# Patient Record
Sex: Female | Born: 1950 | Race: White | Hispanic: No | Marital: Married | Smoking: Former smoker
Health system: Southern US, Community
[De-identification: ages and names within clinical notes are randomized; demographics above are authoritative.]

## PROBLEM LIST (undated history)

## (undated) DIAGNOSIS — J45909 Unspecified asthma, uncomplicated: Secondary | ICD-10-CM

## (undated) DIAGNOSIS — E611 Iron deficiency: Secondary | ICD-10-CM

## (undated) HISTORY — PX: TONSILLECTOMY: SUR1361

## (undated) HISTORY — PX: ABDOMINAL HYSTERECTOMY: SHX81

---

## 2006-10-04 ENCOUNTER — Ambulatory Visit: Payer: Self-pay | Admitting: Internal Medicine

## 2006-10-07 ENCOUNTER — Ambulatory Visit: Payer: Self-pay | Admitting: Internal Medicine

## 2006-10-24 ENCOUNTER — Ambulatory Visit: Payer: Self-pay | Admitting: Internal Medicine

## 2007-08-18 ENCOUNTER — Ambulatory Visit: Payer: Self-pay | Admitting: Family Medicine

## 2008-06-17 ENCOUNTER — Ambulatory Visit: Payer: Self-pay | Admitting: Family Medicine

## 2009-07-22 ENCOUNTER — Ambulatory Visit: Payer: Self-pay | Admitting: Gastroenterology

## 2009-07-25 ENCOUNTER — Ambulatory Visit: Payer: Self-pay | Admitting: Gastroenterology

## 2010-03-09 ENCOUNTER — Ambulatory Visit: Payer: Self-pay | Admitting: Nurse Practitioner

## 2010-03-14 ENCOUNTER — Ambulatory Visit: Payer: Self-pay | Admitting: Family Medicine

## 2011-01-15 ENCOUNTER — Ambulatory Visit: Payer: Self-pay | Admitting: Internal Medicine

## 2014-09-23 ENCOUNTER — Ambulatory Visit
Admit: 2014-09-23 | Disposition: A | Discharge status: Home / Self Care | Payer: Self-pay | Attending: Family Medicine | Admitting: Family Medicine

## 2015-07-04 ENCOUNTER — Other Ambulatory Visit: Payer: Self-pay | Admitting: Orthopedic Surgery

## 2015-07-04 DIAGNOSIS — M25511 Pain in right shoulder: Secondary | ICD-10-CM

## 2015-07-22 ENCOUNTER — Ambulatory Visit
Admission: RE | Admit: 2015-07-22 | Discharge: 2015-07-22 | Disposition: A | Discharge status: Home / Self Care | Payer: 59 | Source: Ambulatory Visit | Attending: Orthopedic Surgery | Admitting: Orthopedic Surgery

## 2015-07-22 DIAGNOSIS — M25511 Pain in right shoulder: Secondary | ICD-10-CM | POA: Diagnosis not present

## 2015-07-22 MED ORDER — IOHEXOL 300 MG/ML  SOLN
10.0000 mL | Freq: Once | INTRAMUSCULAR | Status: AC | PRN
  Administered 2015-07-22: 10 mL via INTRA_ARTICULAR

## 2015-07-22 MED ORDER — GADOBENATE DIMEGLUMINE 529 MG/ML IV SOLN
0.1000 mL | Freq: Once | INTRAVENOUS | Status: AC | PRN
Start: 2015-07-22 — End: 2015-07-22
  Administered 2015-07-22: 0.1 mL via INTRA_ARTICULAR

## 2015-08-04 ENCOUNTER — Other Ambulatory Visit: Payer: Self-pay | Admitting: Orthopedic Surgery

## 2015-08-05 ENCOUNTER — Other Ambulatory Visit: Payer: Self-pay

## 2015-08-05 ENCOUNTER — Encounter
Admission: RE | Admit: 2015-08-05 | Discharge: 2015-08-05 | Disposition: A | Discharge status: Home / Self Care | Payer: 59 | Source: Ambulatory Visit | Attending: Orthopedic Surgery | Admitting: Orthopedic Surgery

## 2015-08-05 DIAGNOSIS — Z01812 Encounter for preprocedural laboratory examination: Secondary | ICD-10-CM | POA: Diagnosis present

## 2015-08-05 DIAGNOSIS — Z01810 Encounter for preprocedural cardiovascular examination: Secondary | ICD-10-CM | POA: Diagnosis present

## 2015-08-05 HISTORY — DX: Iron deficiency: E61.1

## 2015-08-05 HISTORY — DX: Unspecified asthma, uncomplicated: J45.909

## 2015-08-05 LAB — CBC WITH DIFFERENTIAL/PLATELET
Basophils Absolute: 0 10*3/uL (ref 0–0.1)
Basophils Relative: 0 %
EOS ABS: 0.1 10*3/uL (ref 0–0.7)
Eosinophils Relative: 1 %
HEMATOCRIT: 42 % (ref 35.0–47.0)
HEMOGLOBIN: 14.1 g/dL (ref 12.0–16.0)
LYMPHS ABS: 0.4 10*3/uL — AB (ref 1.0–3.6)
LYMPHS PCT: 4 %
MCH: 30 pg (ref 26.0–34.0)
MCHC: 33.6 g/dL (ref 32.0–36.0)
MCV: 89.4 fL (ref 80.0–100.0)
MONOS PCT: 6 %
Monocytes Absolute: 0.6 10*3/uL (ref 0.2–0.9)
NEUTROS PCT: 89 %
Neutro Abs: 10.2 10*3/uL — ABNORMAL HIGH (ref 1.4–6.5)
Platelets: 268 10*3/uL (ref 150–440)
RBC: 4.69 MIL/uL (ref 3.80–5.20)
RDW: 13.9 % (ref 11.5–14.5)
WBC: 11.3 10*3/uL — AB (ref 3.6–11.0)

## 2015-08-05 LAB — BASIC METABOLIC PANEL
Anion gap: 8 (ref 5–15)
BUN: 25 mg/dL — ABNORMAL HIGH (ref 6–20)
CHLORIDE: 105 mmol/L (ref 101–111)
CO2: 27 mmol/L (ref 22–32)
CREATININE: 0.87 mg/dL (ref 0.44–1.00)
Calcium: 9 mg/dL (ref 8.9–10.3)
GFR calc non Af Amer: >60 mL/min (ref >60)
Glucose, Bld: 123 mg/dL — ABNORMAL HIGH (ref 65–99)
POTASSIUM: 4.1 mmol/L (ref 3.5–5.1)
SODIUM: 140 mmol/L (ref 135–145)

## 2015-08-05 LAB — PROTIME-INR
INR: 1.23
Prothrombin Time: 15.7 seconds — ABNORMAL HIGH (ref 11.4–15.0)

## 2015-08-05 LAB — APTT: aPTT: 31 seconds (ref 24–36)

## 2015-08-05 NOTE — Patient Instructions (Signed)
  Your procedure is scheduled on: Tuesday Aug 09, 2015. Report to Same Day Surgery. To find out your arrival time please call [phone] between 1PM - 3PM on Monday Feb. 13, 2017.  Remember: Instructions that are not followed completely may result in serious medical risk, up to and including death, or upon the discretion of your surgeon and anesthesiologist your surgery may need to be rescheduled.    _x___ 1. Do not eat food or drink liquids after midnight. No gum chewing or hard candies.     ____ 2. No Alcohol for 24 hours before or after surgery.   ____ 3. Bring all medications with you on the day of surgery if instructed.    __x__ 4. Notify your doctor if there is any change in your medical condition     (cold, fever, infections).     Do not wear jewelry, make-up, hairpins, clips or nail polish.  Do not wear lotions, powders, or perfumes. You may wear deodorant.  Do not shave 48 hours prior to surgery. Men may shave face and neck.  Do not bring valuables to the hospital.    [facility] is not responsible for any belongings or valuables.               Contacts, dentures or bridgework may not be worn into surgery.  Leave your suitcase in the car. After surgery it may be brought to your room.  For patients admitted to the hospital, discharge time is determined by your treatment team.   Patients discharged the day of surgery will not be allowed to drive home.    Please read over the following fact sheets that you were given:   [facility] Preparing for Surgery  ____ Take these medicines the morning of surgery with A SIP OF WATER: None     ____ Fleet Enema (as directed)   _x___ Use CHG Soap as directed  _x___ Use inhalers on the day of surgery and bring to the hospital.  ____ Stop metformin 2 days prior to surgery    ____ Take 1/2 of usual insulin dose the night before surgery and none on the morning of surgery.   ____ Stop Coumadin/Plavix/aspirin on does not  apply.  ____ Stop Anti-inflammatories on does not apply.  Tylenol OK to use for pain.   ____ Stop supplements until after surgery.    ____ Bring C-Pap to the hospital.

## 2015-08-05 NOTE — Pre-Procedure Instructions (Signed)
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  Spoke with Dr. Rosey Bath, ok to proceed.

## 2015-08-10 ENCOUNTER — Encounter: Admission: RE | Payer: Self-pay | Source: Ambulatory Visit

## 2015-08-10 ENCOUNTER — Ambulatory Visit: Admission: RE | Admit: 2015-08-10 | Payer: 59 | Source: Ambulatory Visit | Admitting: Orthopedic Surgery

## 2015-08-10 SURGERY — ARTHROSCOPY, SHOULDER WITH REPAIR, ROTATOR CUFF, OPEN
Anesthesia: General | Laterality: Right

## 2015-08-24 ENCOUNTER — Ambulatory Visit: Payer: 59 | Admitting: Anesthesiology

## 2015-08-24 ENCOUNTER — Encounter: Payer: Self-pay | Admitting: Anesthesiology

## 2015-08-24 ENCOUNTER — Ambulatory Visit
Admission: RE | Admit: 2015-08-24 | Discharge: 2015-08-24 | Disposition: A | Discharge status: Home / Self Care | Payer: 59 | Source: Ambulatory Visit | Attending: Orthopedic Surgery | Admitting: Orthopedic Surgery

## 2015-08-24 ENCOUNTER — Encounter
Admission: RE | Disposition: A | Discharge status: Home / Self Care | Payer: Self-pay | Source: Ambulatory Visit | Attending: Orthopedic Surgery

## 2015-08-24 DIAGNOSIS — M25511 Pain in right shoulder: Secondary | ICD-10-CM | POA: Diagnosis present

## 2015-08-24 DIAGNOSIS — Z885 Allergy status to narcotic agent status: Secondary | ICD-10-CM | POA: Insufficient documentation

## 2015-08-24 DIAGNOSIS — Z90710 Acquired absence of both cervix and uterus: Secondary | ICD-10-CM | POA: Insufficient documentation

## 2015-08-24 DIAGNOSIS — D509 Iron deficiency anemia, unspecified: Secondary | ICD-10-CM | POA: Diagnosis not present

## 2015-08-24 DIAGNOSIS — J45909 Unspecified asthma, uncomplicated: Secondary | ICD-10-CM | POA: Diagnosis not present

## 2015-08-24 DIAGNOSIS — Z888 Allergy status to other drugs, medicaments and biological substances status: Secondary | ICD-10-CM | POA: Diagnosis not present

## 2015-08-24 DIAGNOSIS — Z7951 Long term (current) use of inhaled steroids: Secondary | ICD-10-CM | POA: Diagnosis not present

## 2015-08-24 DIAGNOSIS — Z79899 Other long term (current) drug therapy: Secondary | ICD-10-CM | POA: Diagnosis not present

## 2015-08-24 DIAGNOSIS — Z87891 Personal history of nicotine dependence: Secondary | ICD-10-CM | POA: Insufficient documentation

## 2015-08-24 DIAGNOSIS — M75121 Complete rotator cuff tear or rupture of right shoulder, not specified as traumatic: Secondary | ICD-10-CM | POA: Diagnosis not present

## 2015-08-24 HISTORY — PX: SHOULDER ARTHROSCOPY WITH OPEN ROTATOR CUFF REPAIR: SHX6092

## 2015-08-24 SURGERY — ARTHROSCOPY, SHOULDER WITH REPAIR, ROTATOR CUFF, OPEN
Anesthesia: General | Laterality: Right

## 2015-08-24 MED ORDER — LIDOCAINE HCL (PF) 1 % IJ SOLN
INTRAMUSCULAR | Status: AC
  Filled 2015-08-24: qty 30

## 2015-08-24 MED ORDER — OXYCODONE HCL 5 MG PO TABS: 5.0000 mg | ORAL_TABLET | ORAL | Status: DC | PRN

## 2015-08-24 MED ORDER — FENTANYL CITRATE (PF) 100 MCG/2ML IJ SOLN
INTRAMUSCULAR | Status: DC
Start: 2015-08-24 — End: 2015-08-24
  Filled 2015-08-24: qty 2

## 2015-08-24 MED ORDER — MIDAZOLAM HCL 2 MG/2ML IJ SOLN
INTRAMUSCULAR | Status: DC | PRN
  Administered 2015-08-24: 2 mg via INTRAVENOUS

## 2015-08-24 MED ORDER — LACTATED RINGERS IV SOLN
INTRAVENOUS | Status: DC
  Administered 2015-08-24 (×2): via INTRAVENOUS

## 2015-08-24 MED ORDER — ONDANSETRON HCL 4 MG/2ML IJ SOLN
INTRAMUSCULAR | Status: DC | PRN
  Administered 2015-08-24: 4 mg via INTRAVENOUS

## 2015-08-24 MED ORDER — LIDOCAINE HCL 1 % IJ SOLN
INTRAMUSCULAR | Status: DC | PRN
  Administered 2015-08-24: 15 mL

## 2015-08-24 MED ORDER — CEFAZOLIN SODIUM-DEXTROSE 2-3 GM-% IV SOLR
INTRAVENOUS | Status: AC
  Filled 2015-08-24: qty 50

## 2015-08-24 MED ORDER — FENTANYL CITRATE (PF) 100 MCG/2ML IJ SOLN
25.0000 ug | INTRAMUSCULAR | Status: DC | PRN
  Administered 2015-08-24 (×2): 25 ug via INTRAVENOUS

## 2015-08-24 MED ORDER — PROMETHAZINE HCL 25 MG/ML IJ SOLN
6.2500 mg | Freq: Four times a day (QID) | INTRAMUSCULAR | Status: DC | PRN
  Administered 2015-08-24: 6.25 mg via INTRAVENOUS

## 2015-08-24 MED ORDER — BUPIVACAINE HCL 0.25 % IJ SOLN
INTRAMUSCULAR | Status: DC | PRN
  Administered 2015-08-24: 30 mL

## 2015-08-24 MED ORDER — NEOMYCIN-POLYMYXIN B GU 40-200000 IR SOLN
Status: AC
  Filled 2015-08-24: qty 2

## 2015-08-24 MED ORDER — BUPIVACAINE-EPINEPHRINE (PF) 0.25% -1:200000 IJ SOLN
INTRAMUSCULAR | Status: AC
  Filled 2015-08-24: qty 30

## 2015-08-24 MED ORDER — ONDANSETRON HCL 4 MG PO TABS: 4.0000 mg | ORAL_TABLET | Freq: Three times a day (TID) | ORAL | Status: AC | PRN

## 2015-08-24 MED ORDER — PHENYLEPHRINE HCL 10 MG/ML IJ SOLN
INTRAMUSCULAR | Status: DC | PRN
  Administered 2015-08-24 (×4): 100 ug via INTRAVENOUS

## 2015-08-24 MED ORDER — CEFAZOLIN SODIUM-DEXTROSE 2-3 GM-% IV SOLR
2.0000 g | INTRAVENOUS | Status: AC
  Administered 2015-08-24: 2 g via INTRAVENOUS

## 2015-08-24 MED ORDER — NEOSTIGMINE METHYLSULFATE 10 MG/10ML IV SOLN
INTRAVENOUS | Status: DC | PRN
  Administered 2015-08-24: 1 mg via INTRAVENOUS

## 2015-08-24 MED ORDER — ONDANSETRON HCL 4 MG/2ML IJ SOLN
INTRAMUSCULAR | Status: AC
  Filled 2015-08-24: qty 2

## 2015-08-24 MED ORDER — DEXAMETHASONE SODIUM PHOSPHATE 10 MG/ML IJ SOLN
INTRAMUSCULAR | Status: DC | PRN
  Administered 2015-08-24: 10 mg via INTRAVENOUS

## 2015-08-24 MED ORDER — SUCCINYLCHOLINE CHLORIDE 20 MG/ML IJ SOLN
INTRAMUSCULAR | Status: DC | PRN
  Administered 2015-08-24: 100 mg via INTRAVENOUS

## 2015-08-24 MED ORDER — FAMOTIDINE 20 MG PO TABS
20.0000 mg | ORAL_TABLET | Freq: Once | ORAL | Status: AC
  Administered 2015-08-24: 20 mg via ORAL

## 2015-08-24 MED ORDER — FENTANYL CITRATE (PF) 100 MCG/2ML IJ SOLN
INTRAMUSCULAR | Status: DC | PRN
  Administered 2015-08-24: 100 ug via INTRAVENOUS
  Administered 2015-08-24 (×3): 50 ug via INTRAVENOUS

## 2015-08-24 MED ORDER — ONDANSETRON HCL 4 MG/2ML IJ SOLN
4.0000 mg | Freq: Once | INTRAMUSCULAR | Status: AC | PRN
Start: 2015-08-24 — End: 2015-08-24
  Administered 2015-08-24: 4 mg via INTRAVENOUS

## 2015-08-24 MED ORDER — EPINEPHRINE HCL 1 MG/ML IJ SOLN
INTRAMUSCULAR | Status: AC
  Filled 2015-08-24: qty 1

## 2015-08-24 MED ORDER — ROCURONIUM BROMIDE 100 MG/10ML IV SOLN
INTRAVENOUS | Status: DC | PRN
  Administered 2015-08-24: 20 mg via INTRAVENOUS

## 2015-08-24 MED ORDER — PROMETHAZINE HCL 25 MG/ML IJ SOLN
INTRAMUSCULAR | Status: AC
  Filled 2015-08-24: qty 1

## 2015-08-24 MED ORDER — SODIUM CHLORIDE 0.9 % IJ SOLN
INTRAMUSCULAR | Status: AC
  Filled 2015-08-24: qty 10

## 2015-08-24 MED ORDER — PROPOFOL 10 MG/ML IV BOLUS
INTRAVENOUS | Status: DC | PRN
  Administered 2015-08-24: 150 mg via INTRAVENOUS

## 2015-08-24 MED ORDER — FAMOTIDINE 20 MG PO TABS
ORAL_TABLET | ORAL | Status: AC
  Administered 2015-08-24: 20 mg via ORAL
  Filled 2015-08-24: qty 1

## 2015-08-24 MED ORDER — GLYCOPYRROLATE 0.2 MG/ML IJ SOLN
INTRAMUSCULAR | Status: DC | PRN
  Administered 2015-08-24: 0.4 mg via INTRAVENOUS

## 2015-08-24 MED ORDER — EPHEDRINE SULFATE 50 MG/ML IJ SOLN
INTRAMUSCULAR | Status: DC | PRN
  Administered 2015-08-24: 10 mg via INTRAVENOUS

## 2015-08-24 SURGICAL SUPPLY — 68 items
ADAPTER IRRIG TUBE 2 SPIKE SOL (ADAPTER) ×6 IMPLANT
ANCHOR ALL-SUT Q-FIX 2.8 (Anchor) ×3 IMPLANT
BUR RADIUS 4.0X18.5 (BURR) ×3 IMPLANT
BUR RADIUS 5.5 (BURR) ×3 IMPLANT
CANNULA 5.75X7 CRYSTAL CLEAR (CANNULA) ×3 IMPLANT
CANNULA PARTIAL THREAD 2X7 (CANNULA) ×3 IMPLANT
CANNULA TWIST IN 8.25X9CM (CANNULA) ×6 IMPLANT
CLOSURE WOUND 1/2 X4 (GAUZE/BANDAGES/DRESSINGS) ×1
CONNECTOR M SMARTSTITCH (Connector) ×3 IMPLANT
CONNECTOR PERFECT PASSER (CONNECTOR) ×3 IMPLANT
COOLER POLAR GLACIER W/PUMP (MISCELLANEOUS) ×3 IMPLANT
CRADLE LAMINECT ARM (MISCELLANEOUS) ×3 IMPLANT
DRAPE IMP U-DRAPE 54X76 (DRAPES) ×6 IMPLANT
DRAPE INCISE IOBAN 66X45 STRL (DRAPES) ×3 IMPLANT
DRAPE U-SHAPE 47X51 STRL (DRAPES) ×3 IMPLANT
DURAPREP 26ML APPLICATOR (WOUND CARE) ×9 IMPLANT
ELECT REM PT RETURN 9FT ADLT (ELECTROSURGICAL) ×3
ELECTRODE REM PT RTRN 9FT ADLT (ELECTROSURGICAL) ×1 IMPLANT
GAUZE PETRO XEROFOAM 1X8 (MISCELLANEOUS) ×3 IMPLANT
GAUZE SPONGE 4X4 12PLY STRL (GAUZE/BANDAGES/DRESSINGS) ×6 IMPLANT
GLOVE BIOGEL PI IND STRL 9 (GLOVE) ×1 IMPLANT
GLOVE BIOGEL PI INDICATOR 9 (GLOVE) ×2
GLOVE SURG 9.0 ORTHO LTXF (GLOVE) ×6 IMPLANT
GOWN STRL REUS TWL 2XL XL LVL4 (GOWN DISPOSABLE) ×3 IMPLANT
GOWN STRL REUS W/ TWL LRG LVL3 (GOWN DISPOSABLE) ×1 IMPLANT
GOWN STRL REUS W/ TWL LRG LVL4 (GOWN DISPOSABLE) ×1 IMPLANT
GOWN STRL REUS W/TWL LRG LVL3 (GOWN DISPOSABLE) ×2
GOWN STRL REUS W/TWL LRG LVL4 (GOWN DISPOSABLE) ×2
IV LACTATED RINGER IRRG 3000ML (IV SOLUTION) ×20
IV LR IRRIG 3000ML ARTHROMATIC (IV SOLUTION) ×10 IMPLANT
KIT RM TURNOVER STRD PROC AR (KITS) ×3 IMPLANT
KIT STABILIZATION SHOULDER (MISCELLANEOUS) ×3 IMPLANT
KIT SUTURE 2.8 Q-FIX DISP (MISCELLANEOUS) ×6 IMPLANT
MANIFOLD NEPTUNE II (INSTRUMENTS) ×3 IMPLANT
MASK FACE SPIDER DISP (MASK) ×3 IMPLANT
MAT BLUE FLOOR 46X72 FLO (MISCELLANEOUS) ×6 IMPLANT
NDL SAFETY 18GX1.5 (NEEDLE) ×3 IMPLANT
NDL SAFETY 22GX1.5 (NEEDLE) ×3 IMPLANT
NS IRRIG 500ML POUR BTL (IV SOLUTION) ×3 IMPLANT
PACK ARTHROSCOPY SHOULDER (MISCELLANEOUS) ×3 IMPLANT
PAD WRAPON POLAR SHDR UNIV (MISCELLANEOUS) ×1 IMPLANT
PASSER SUT CAPTURE FIRST (SUTURE) ×3 IMPLANT
SET TUBE SUCT SHAVER OUTFL 24K (TUBING) ×3 IMPLANT
SET TUBE TIP INTRA-ARTICULAR (MISCELLANEOUS) ×3 IMPLANT
SMARTSTITCH M-CONNECTOR ×3 IMPLANT
STRAP SAFETY BODY (MISCELLANEOUS) ×3 IMPLANT
STRIP CLOSURE SKIN 1/2X4 (GAUZE/BANDAGES/DRESSINGS) ×2 IMPLANT
SUT ETHILON 4-0 (SUTURE) ×2
SUT ETHILON 4-0 FS2 18XMFL BLK (SUTURE) ×1
SUT KNTLS 2.8 MAGNUM (Anchor) ×9 IMPLANT
SUT MNCRL 4-0 (SUTURE) ×2
SUT MNCRL 4-0 27XMFL (SUTURE) ×1
SUT PDS AB 0 CT1 27 (SUTURE) ×6 IMPLANT
SUT PERFECTPASSER WHITE CART (SUTURE) ×6 IMPLANT
SUT SMART STITCH CARTRIDGE (SUTURE) ×3 IMPLANT
SUT VIC AB 0 CT1 36 (SUTURE) ×6 IMPLANT
SUT VIC AB 2-0 CT2 27 (SUTURE) ×3 IMPLANT
SUTURE ETHLN 4-0 FS2 18XMF BLK (SUTURE) ×1 IMPLANT
SUTURE MAGNUM WIRE 2X48 BLK (SUTURE) ×6 IMPLANT
SUTURE MNCRL 4-0 27XMF (SUTURE) ×1 IMPLANT
SUTURE OPUS MAGNUM SZ 2 WHT (SUTURE) ×9 IMPLANT
SYRINGE 10CC LL (SYRINGE) ×3 IMPLANT
TAPE MICROFOAM 4IN (TAPE) ×3 IMPLANT
TUBING ARTHRO INFLOW-ONLY STRL (TUBING) ×3 IMPLANT
TUBING CONNECTING 10 (TUBING) ×2 IMPLANT
TUBING CONNECTING 10' (TUBING) ×1
WAND HAND CNTRL MULTIVAC 90 (MISCELLANEOUS) ×3 IMPLANT
WRAPON POLAR PAD SHDR UNIV (MISCELLANEOUS) ×3

## 2015-08-24 NOTE — H&P (Signed)
The patient has been re-examined, and the chart reviewed, and there have been no interval changes to the documented history and physical.    The risks, benefits, and alternatives have been discussed at length, and the patient is willing to proceed.   

## 2015-08-24 NOTE — Anesthesia Postprocedure Evaluation (Signed)
Anesthesia Post Note  Patient: Maria Gardner  Procedure(s) Performed: Procedure(s) (LRB): SHOULDER ARTHROSCOPY WITH OPEN ROTATOR CUFF REPAIR (Right)  Patient location during evaluation: PACU Anesthesia Type: General Level of consciousness: awake and alert Pain management: pain level controlled Vital Signs Assessment: post-procedure vital signs reviewed and stable Respiratory status: spontaneous breathing, nonlabored ventilation, respiratory function stable and patient connected to nasal cannula oxygen Cardiovascular status: blood pressure returned to baseline and stable Postop Assessment: no signs of nausea or vomiting Anesthetic complications: no    Last Vitals:  Filed Vitals:   08/24/15 1647 08/24/15 1702  BP: 117/60 116/63  Pulse: 78 82  Temp:    Resp: 16 14    Last Pain:  Filed Vitals:   08/24/15 1716  PainSc: Asleep                 Precious Haws Piscitello

## 2015-08-24 NOTE — Transfer of Care (Signed)
Immediate Anesthesia Transfer of Care Note  Patient: Maria Gardner  Procedure(s) Performed: Procedure(s): SHOULDER ARTHROSCOPY WITH OPEN ROTATOR CUFF REPAIR (Right)  Patient Location: PACU  Anesthesia Type:General  Level of Consciousness: sedated and responds to stimulation  Airway & Oxygen Therapy: Patient Spontanous Breathing and Patient connected to face mask oxygen  Post-op Assessment: Report given to RN and Post -op Vital signs reviewed and stable  Post vital signs: Reviewed and stable  Last Vitals:  Filed Vitals:   08/24/15 1142 08/24/15 1617  BP: 139/77 124/63  Pulse: 69 81  Temp: 36.5 C 36.2 C  Resp: 18 17    Complications: No apparent anesthesia complications

## 2015-08-24 NOTE — Anesthesia Procedure Notes (Signed)
Procedure Name: Intubation Date/Time: 08/24/2015 1:21 PM Performed by: Jonna Clark Pre-anesthesia Checklist: Patient identified, Patient being monitored, Timeout performed, Emergency Drugs available and Suction available Patient Re-evaluated:Patient Re-evaluated prior to inductionOxygen Delivery Method: Circle system utilized Preoxygenation: Pre-oxygenation with 100% oxygen Intubation Type: IV induction Ventilation: Mask ventilation without difficulty Laryngoscope Size: Miller and 2 Grade View: Grade I Tube type: Oral Tube size: 7.0 mm Number of attempts: 1 Placement Confirmation: ETT inserted through vocal cords under direct vision,  positive ETCO2 and breath sounds checked- equal and bilateral Secured at: 21 cm Tube secured with: Tape Dental Injury: Teeth and Oropharynx as per pre-operative assessment

## 2015-08-24 NOTE — Op Note (Signed)
08/24/2015  4:26 PM  PATIENT:  Maria Gardner  65 y.o. 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    The risks benefits and alternatives were discussed with the patient preoperatively including but not limited to the risks of infection, bleeding, nerve injury, persistent pain or weakness, failure of the hardware, re-tear of the rotator cuff and the need for further surgery. Medical risks include DVT and pulmonary embolism, myocardial infarction, stroke, pneumonia, respiratory failure and death. Patient understood these risks and wished to proceed.  OPERATIVE IMPLANTS: ArthroCare Magnum 2 anchors x 3 & [employer] Q Fix anchors x 1  OPERATIVE FINDINGS: Full thickness tear of the supraspinatus  OPERATIVE PROCEDURE: The patient was met in the preoperative area. The operative shoulder was signed with the word yes and my initials according the hospital's correct site of surgery protocol. The patient was then brought to the operating room where she underwent general endotracheal intubation and then placed in a beachchair position.  A spider arm positioner was used for this case. Examination under anesthesia revealed no limitation of motion or instability with load shift testing. The patient had a negative sulcus sign.  Patient was prepped and draped in a sterile fashion. A  timeout was performed to verify the patient's name, date of birth, medical record number, correct site of surgery and correct procedure to be performed there was also used to verify the patient received antibiotics that all appropriate instruments, implants and radiographs studies were available in the room. Once all in attendance were in agreement case began.  Bony landmarks were drawn out with a surgical marker along with proposed arthroscopy incisions. These were pre-injected with 1% lidocaine plain. An 11 blade was used to establish a posterior portal through which the arthroscope was placed in the glenohumeral joint. A full diagnostic examination of the shoulder was performed.  Patient was found t0 have a full-thss tear involving the supraspinatus  There was fraying of the superior labrum without SLAP tear.  A portion of the patient's intra-articular biceps tendon was adherent to the rotator cuff.  These adhesions between the 2 tendons were divided using a arthroscopic elevator.  An 18-gauge spinal needle was used to place a 0 PDS suture through the rotator cuff tear so it could be identified on the bursal side.    The arthroscope was then placed in the subacromial space. A lateral port created using an 18G spinal needle for locatlization. Extensive bursitis was encountered and debrided using a 4-0 resector shaver blade and a 90 ArthroCare wand . A subacromial decompression was also performed through the lateral portal using a 5.5 mm resector shaver blade. The rotator cuff tear was easily identified and the 0 PDS suture was removed.  Two Arthrocare Smart stitches were placed in the lateral border of the rotator cuff tear.  A 5-72m resector shaver blade was used to debride the greater tuberosity of all torn fibers of the rotator cuff until  punctate bleeding was identified.  All arthroscopic instruments were then removed and the mini-open portion of the procedure began.   A saber-type incision was made  along the lateral border of the acromion. The deltoid muscle was identified and split in line with its fibers which allowed visualization of the rotator cuff. The Smart stitches previously placed in the lateral border of the rotator cuff were brought out through the deltoid split.   A third Smart stitch was placed in the rotator cuff tear. One Tamala Julian and [employer] anchor was placed at the articular margin of the humeral head with the greater tuberosity. The suture limbs of the Q Fix anchor were passed medially through the rotator cuff using a First Pass suture passer.  The Smart stitches in the lateral portion of the rotator cuff tear were then anchored to the lateral aspect of the greater tuberosity footprint using three Magnum 2 anchors. These anchors were tensioned to allow for anatomic reduction of the rotator cuff to the greater tuberosity. The medial row sutures were then tied down using an arthroscopic knot tying technique.  Arthroscopic images of the repair were taken with the arthroscope both externally and from inside the glenohumeral joint.  All incisions were copiously irrigated. The deltoid fascia was repaired using a 0 Vicryl suture.  The subcutaneous tissue of all incisions were closed with a 2-0 Vicryl. Skin closure for the arthroscopic incisions was performed with 4-0 nylon. The skin edges of the saber incision was approximated with a running 4-0 undyed Monocryl.  0.25% Marcaine plain was then injected into the subacromial space for postoperative pain control. A dry sterile dressing was applied.  The patient was placed in an abduction sling, with a Polar Caresleeve, a TENS unit.  All sharp and instrument counts were correct at the conclusion of the case. I was scrubbed and present for the entire case. I spoke with the patient's husband postoperatively to let them know the case had gone without complication and the patient was stable in recovery room.  I let her daughter know the surgery had  gone well via phone.

## 2015-08-24 NOTE — Discharge Instructions (Signed)

## 2015-08-24 NOTE — Anesthesia Preprocedure Evaluation (Addendum)
Anesthesia Evaluation  Patient identified by MRN, date of birth, ID band Patient awake    Reviewed: Allergy & Precautions, NPO status , Patient's Chart, lab work & pertinent test results, reviewed documented beta blocker date and time   Airway Mallampati: III  TM Distance: >3 FB     Dental  (+) Chipped   Pulmonary asthma , former smoker,           Cardiovascular      Neuro/Psych    GI/Hepatic   Endo/Other    Renal/GU      Musculoskeletal   Abdominal   Peds  Hematology   Anesthesia Other Findings She refuses ISNB. EKG reviewed and OK. No CXR needed. No pulmonary symptoms now.  Reproductive/Obstetrics                           Anesthesia Physical Anesthesia Plan  ASA: III  Anesthesia Plan: General   Post-op Pain Management:    Induction: Intravenous  Airway Management Planned: Oral ETT  Additional Equipment:   Intra-op Plan:   Post-operative Plan:   Informed Consent: I have reviewed the patients History and Physical, chart, labs and discussed the procedure including the risks, benefits and alternatives for the proposed anesthesia with the patient or authorized representative who has indicated his/her understanding and acceptance.     Plan Discussed with: CRNA  Anesthesia Plan Comments:         Anesthesia Quick Evaluation

## 2015-08-24 NOTE — H&P (Signed)
PREOPERATIVE H&P  Chief Complaint: M75.121 Complete rotatr-cuff tear/ruptr of r shoulder, not trauma  HPI: Maria Gardner is a 65 y.o. female who presents for preoperative history and physical with a diagnosis of M75.121 Complete rotatr-cuff tear/ruptr of r shoulder, not trauma. Symptoms are rated as moderate to severe, and have been worsening.  This is significantly impairing activities of daily living.  She has elected for surgical management.   Past Medical History  Diagnosis Date  . Asthma   . Iron deficiency    Past Surgical History  Procedure Laterality Date  . Tonsillectomy    . Abdominal hysterectomy     Social History   Social History  . Marital Status: Married    Spouse Name: N/A  . Number of Children: N/A  . Years of Education: N/A   Social History Main Topics  . Smoking status: Former Smoker    Quit date: 08/04/1973  . Smokeless tobacco: None  . Alcohol Use: Yes     Comment: 1 glass of wine 2 times a year  . Drug Use: No  . Sexual Activity: Not Asked   Other Topics Concern  . None   Social History Narrative   History reviewed. No pertinent family history. Allergies  Allergen Reactions  . Ammonia Other (See Comments)    sends pt into an asthma attack  . Compazine [Prochlorperazine Edisylate] Other (See Comments)    Muscular contractions on face and hands  . Morphine And Related Other (See Comments)    Doubled over in pain and passing out  . Terbinafine And Related Other (See Comments)    Stroke  Symptoms, loss of feeling in face and swelling, weakness in right side of the body.   Prior to Admission medications   Medication Sig Start Date End Date Taking? Authorizing Provider  acetaminophen (TYLENOL) 325 MG tablet Take 650 mg by mouth every 6 (six) hours as needed.   Yes Historical Provider, MD  albuterol (PROVENTIL HFA;VENTOLIN HFA) 108 (90 Base) MCG/ACT inhaler Inhale 1 puff into the lungs as needed for wheezing or shortness of breath.   Yes  Historical Provider, MD  ipratropium (ATROVENT HFA) 17 MCG/ACT inhaler Inhale 1 puff into the lungs as needed for wheezing.   Yes Historical Provider, MD  ALBUTEROL IN Inhale 1 puff into the lungs as needed (for shortness of breath).    Historical Provider, MD     Positive ROS: All other systems have been reviewed and were otherwise negative with the exception of those mentioned in the HPI and as above.  Physical Exam: General: Alert, no acute distress Cardiovascular: Regular rate and rhythm, no murmurs rubs or gallops.  No pedal edema Respiratory: Clear to auscultation bilaterally, no wheezes rales or rhonchi. No cyanosis, no use of accessory musculature GI: No organomegaly, abdomen is soft and non-tender nondistended with positive bowel sounds. Skin: Skin intact, no lesions within the operative field. Neurologic: Sensation intact distally Psychiatric: Patient is competent for consent with normal mood and affect Lymphatic: No cervical lymphadenopathy  MUSCULOSKELETAL: Right shoulder/upper extremity:  Skin is intact. There is no erythema or ecchymosis or swelling. Patient has pain with active abduction. She has pain with a downward directed force on her abducted shoulder. She has positive impingement signs but no apprehension or instability. Patient demonstrates significant weakness of her supraspinatus and to a lesser extend the external rotators on exam today. She has palpable radial pulse. She has intact sensation light touch. She has full digital wrist and elbow range of  motion.   Assessment: M75.121 Complete rotatr-cuff tear/ruptr of r shoulder, not trauma  Plan: Plan for Procedure(s): SHOULDER ARTHROSCOPY WITH OPEN ROTATOR CUFF REPAIR  Was then we still had long-standing right shoulder pain affecting her ability to perform activities overhead and with abduction. She has failed all nonoperative management including physical therapy. Patient has an MRI which is confirmed a  full-thickness and retracted tear involving the supra and likely infraspinatus tendons. Plan will be for right shoulder arthroscopy with mini open rotator cuff repair. I explained this procedure in detail to her as well as the postoperative recovery. Patient is in agreement with the plan for surgery.  I discussed the risks and benefits of surgery. The risks include but are not limited to infection, bleeding requiring blood transfusion, nerve or blood vessel injury, joint stiffness or loss of motion, persistent pain, weakness or instability and hardware failure and the need for further surgery. Medical risks include but are not limited to DVT and pulmonary embolism, myocardial infarction, stroke, pneumonia, respiratory failure and death. Patient understood these risks and wished to proceed.   Thornton Park, MD   08/24/2015 12:51 PM

## 2015-08-25 ENCOUNTER — Encounter: Payer: Self-pay | Admitting: Orthopedic Surgery

## 2016-04-16 ENCOUNTER — Other Ambulatory Visit: Payer: Self-pay | Admitting: Family Medicine

## 2016-04-16 DIAGNOSIS — Z1231 Encounter for screening mammogram for malignant neoplasm of breast: Secondary | ICD-10-CM

## 2016-04-20 ENCOUNTER — Other Ambulatory Visit: Payer: Self-pay | Admitting: Family Medicine

## 2016-04-20 ENCOUNTER — Ambulatory Visit
Admission: RE | Admit: 2016-04-20 | Discharge: 2016-04-20 | Disposition: A | Discharge status: Home / Self Care | Payer: 59 | Source: Ambulatory Visit | Attending: Family Medicine | Admitting: Family Medicine

## 2016-04-20 DIAGNOSIS — Z1231 Encounter for screening mammogram for malignant neoplasm of breast: Secondary | ICD-10-CM

## 2016-09-08 ENCOUNTER — Encounter: Payer: Self-pay | Admitting: Medical Oncology

## 2016-09-08 ENCOUNTER — Emergency Department: Payer: Medicare HMO

## 2016-09-08 ENCOUNTER — Emergency Department
Admission: EM | Admit: 2016-09-08 | Discharge: 2016-09-08 | Disposition: A | Discharge status: Home / Self Care | Payer: Medicare HMO | Attending: Emergency Medicine | Admitting: Emergency Medicine

## 2016-09-08 DIAGNOSIS — J45901 Unspecified asthma with (acute) exacerbation: Secondary | ICD-10-CM | POA: Diagnosis not present

## 2016-09-08 DIAGNOSIS — Z87891 Personal history of nicotine dependence: Secondary | ICD-10-CM | POA: Insufficient documentation

## 2016-09-08 DIAGNOSIS — R05 Cough: Secondary | ICD-10-CM | POA: Diagnosis present

## 2016-09-08 DIAGNOSIS — J101 Influenza due to other identified influenza virus with other respiratory manifestations: Secondary | ICD-10-CM | POA: Diagnosis not present

## 2016-09-08 LAB — CBC WITH DIFFERENTIAL/PLATELET
Basophils Absolute: 0 10*3/uL (ref 0–0.1)
Basophils Relative: 0 %
EOS ABS: 0 10*3/uL (ref 0–0.7)
EOS PCT: 1 %
HCT: 39.5 % (ref 35.0–47.0)
HEMOGLOBIN: 13.7 g/dL (ref 12.0–16.0)
LYMPHS ABS: 1.1 10*3/uL (ref 1.0–3.6)
Lymphocytes Relative: 32 %
MCH: 31.1 pg (ref 26.0–34.0)
MCHC: 34.6 g/dL (ref 32.0–36.0)
MCV: 89.7 fL (ref 80.0–100.0)
MONOS PCT: 11 %
Monocytes Absolute: 0.4 10*3/uL (ref 0.2–0.9)
NEUTROS PCT: 56 %
Neutro Abs: 1.9 10*3/uL (ref 1.4–6.5)
PLATELETS: 196 10*3/uL (ref 150–440)
RBC: 4.4 MIL/uL (ref 3.80–5.20)
RDW: 13.2 % (ref 11.5–14.5)
WBC: 3.4 10*3/uL — ABNORMAL LOW (ref 3.6–11.0)

## 2016-09-08 LAB — BASIC METABOLIC PANEL
Anion gap: 8 (ref 5–15)
BUN: 10 mg/dL (ref 6–20)
CALCIUM: 8.8 mg/dL — AB (ref 8.9–10.3)
CHLORIDE: 105 mmol/L (ref 101–111)
CO2: 24 mmol/L (ref 22–32)
CREATININE: 0.82 mg/dL (ref 0.44–1.00)
Glucose, Bld: 98 mg/dL (ref 65–99)
Potassium: 3.8 mmol/L (ref 3.5–5.1)
SODIUM: 137 mmol/L (ref 135–145)

## 2016-09-08 LAB — FIBRIN DERIVATIVES D-DIMER (ARMC ONLY): FIBRIN DERIVATIVES D-DIMER (ARMC): 467.43 (ref 0.00–499.00)

## 2016-09-08 LAB — INFLUENZA PANEL BY PCR (TYPE A & B)
INFLAPCR: NEGATIVE
Influenza B By PCR: POSITIVE — AB

## 2016-09-08 LAB — MAGNESIUM: MAGNESIUM: 2.2 mg/dL (ref 1.7–2.4)

## 2016-09-08 MED ORDER — ALBUTEROL SULFATE (2.5 MG/3ML) 0.083% IN NEBU: 2.5000 mg | INHALATION_SOLUTION | Freq: Four times a day (QID) | RESPIRATORY_TRACT | 0 refills | Status: AC | PRN

## 2016-09-08 MED ORDER — IPRATROPIUM-ALBUTEROL 0.5-2.5 (3) MG/3ML IN SOLN
3.0000 mL | Freq: Once | RESPIRATORY_TRACT | Status: AC
  Administered 2016-09-08: 3 mL via RESPIRATORY_TRACT

## 2016-09-08 MED ORDER — PREDNISONE 20 MG PO TABS: ORAL_TABLET | ORAL | 0 refills | Status: DC

## 2016-09-08 MED ORDER — IPRATROPIUM-ALBUTEROL 0.5-2.5 (3) MG/3ML IN SOLN
RESPIRATORY_TRACT | Status: AC
  Administered 2016-09-08: 3 mL via RESPIRATORY_TRACT
  Filled 2016-09-08: qty 3

## 2016-09-08 NOTE — ED Triage Notes (Signed)
Pt reports for the past 2 weeks she has been having cough and sob with her asthma. Pt reports she has been seen recently and was placed on antibiotics for sinus infection.

## 2016-09-08 NOTE — ED Notes (Signed)

## 2016-09-08 NOTE — ED Provider Notes (Signed)
[facility] Emergency Department Provider Note  Time seen: Approximately 9:21 AM  I have reviewed the triage vital signs and the nursing notes.   HISTORY  Chief Complaint Cough   HPI Maria Gardner is a 65 y.o. female presenting for the complaints of approximately 1 week of runny nose, nasal congestion and cough. Patient reports she did have sinus pressure and sinus pain discomfort which has since resolved but continues with nasal drainage and congestion. Reports sore throat was worse and now improved, but continues. Reports cough as her biggest complaint. Reports cough is primarily nonproductive, occasionally productive with intermittent associated wheezing. Patient reports that she was seen by her primary care physician office 3 days ago was started on oral amoxicillin for sinusitis, refill albuterol inhaler and started on when necessary Tessalon Perles. Patient reports symptoms have continued. Denies current pain. Denies fevers.   Reports chronic history of asthma with similar presentation. Has used home albuterol inhaler intermittent with mild improvement, no resolution. Reports overall continues to eat and drink well. Patient reports that she does have some mid to left-sided back pain with coughing only. Denies any pain when sitting still at rest. Denies any chest pain. Reports occasional left back pain with deep inhalation, denies current. Denies hemoptysis. Denies recent long trips, car rides, plane ride, surgery, previous PE or DVT. Reports mother with history of von Willebrand's, denies other family history of clotting disorders. Patient does also report generalized tired feeling, that has been present only during this current sickness. Also reports cough frequently waking her up at night.   Denies chest pain, chest pressure, shortness of breath, dysuria, extremity pain, extremity swelling or rash.  patient reports that she has been having recurrent abdominal  issues, denies current abdominal pain or abdominal issues and reports being followed by her primary care physician for the same complaint. Denies any abnormal colored stools or blood in stool. Denies recent sickness. Denies recent antibiotic use.   [facility], DALE E, MD: PCP   Past Medical History:  Diagnosis Date  . Asthma   . Iron deficiency     There are no active problems to display for this patient.   Past Surgical History:  Procedure Laterality Date  . ABDOMINAL HYSTERECTOMY    . SHOULDER ARTHROSCOPY WITH OPEN ROTATOR CUFF REPAIR Right 08/24/2015   Procedure: SHOULDER ARTHROSCOPY WITH OPEN ROTATOR CUFF REPAIR;  Surgeon: Thornton Park, MD;  Location: [facility] ORS;  Service: Orthopedics;  Laterality: Right;  . TONSILLECTOMY       No current facility-administered medications for this encounter.   Current Outpatient Prescriptions:  .  acetaminophen (TYLENOL) 325 MG tablet, Take 650 mg by mouth every 6 (six) hours as needed., Disp: , Rfl:  .  albuterol (PROVENTIL HFA;VENTOLIN HFA) 108 (90 Base) MCG/ACT inhaler, Inhale 1 puff into the lungs as needed for wheezing or shortness of breath., Disp: , Rfl:  .  albuterol (PROVENTIL) (2.5 MG/3ML) 0.083% nebulizer solution, Take 3 mLs (2.5 mg total) by nebulization every 6 (six) hours as needed for wheezing or shortness of breath., Disp: 60 mL, Rfl: 0 .  ALBUTEROL IN, Inhale 1 puff into the lungs as needed (for shortness of breath)., Disp: , Rfl:  .  ipratropium (ATROVENT HFA) 17 MCG/ACT inhaler, Inhale 1 puff into the lungs as needed for wheezing., Disp: , Rfl:  .  ondansetron (ZOFRAN) 4 MG tablet, Take 1 tablet (4 mg total) by mouth every 8 (eight) hours as needed for nausea or vomiting., Disp: 30  tablet, Rfl: 0 .  oxyCODONE (OXY IR/ROXICODONE) 5 MG immediate release tablet, Take 1-2 tablets (5-10 mg total) by mouth every 4 (four) hours as needed for severe pain., Disp: 40 tablet, Rfl: 0 .  predniSONE (DELTASONE) 20 MG tablet, Take 40 mg  orally daily x 3 days, then 20 mg orally daily x 2 days., Disp: 8 tablet, Rfl: 0  Allergies Ammonia; Compazine [prochlorperazine edisylate]; Morphine and related; and Terbinafine and related  Family History  Problem Relation Age of Onset  . Breast cancer Neg Hx     Social History Social History  Substance Use Topics  . Smoking status: Former Smoker    Quit date: 08/04/1973  . Smokeless tobacco: Not on file  . Alcohol use Yes     Comment: 1 glass of wine 2 times a year    Review of Systems Constitutional: No fever/chills Eyes: No visual changes. ENT: As above.  Cardiovascular: Denies chest pain. Respiratory: Denies shortness of breath. As above.  Gastrointestinal: No abdominal pain.  No nausea, no vomiting.  No diarrhea.  Genitourinary: Negative for dysuria. Musculoskeletal: As above. Denies fall or injury. Skin: Negative for rash. Neurological: Negative for focal weakness or numbness.  10-point ROS otherwise negative.  ____________________________________________   PHYSICAL EXAM:  VITAL SIGNS: ED Triage Vitals  Enc Vitals Group     BP 09/08/16 0916 (!) 134/54     Pulse Rate 09/08/16 0916 73     Resp 09/08/16 0916 18     Temp 09/08/16 0916 98.1 F (36.7 C)     Temp Source 09/08/16 0916 Oral     SpO2 09/08/16 0916 96 %     Weight 09/08/16 0916 196 lb (88.9 kg)     Height 09/08/16 0916 5\' 2"  (1.575 m)     Head Circumference --      Peak Flow --      Pain Score 09/08/16 0917 6     Pain Loc --      Pain Edu? --      Excl. in [place]? --    Vitals:   09/08/16 0916 09/08/16 1208 09/08/16 1217  BP: (!) 134/54 125/66   Pulse: 73 66 67  Resp: 18 16   Temp: 98.1 F (36.7 C) 97.9 F (36.6 C)   TempSrc: Oral Oral   SpO2: 96% 93% 97%  Weight: 88.9 kg    Height: 5\' 2"  (1.575 m)      Constitutional: Alert and oriented. Well appearing and in no acute distress. Eyes: Conjunctivae are normal. PERRL. EOMI. Head: Atraumatic. No sinus tenderness to palpation. No  swelling. No erythema.  Ears: no erythema, normal TMs bilaterally.   Nose:Nasal congestion with clear rhinorrhea  Mouth/Throat: Mucous membranes are moist. No pharyngeal erythema. No tonsillar swelling or exudate.  Neck: No stridor.  No cervical spine tenderness to palpation. Hematological/Lymphatic/Immunilogical: No cervical lymphadenopathy. Cardiovascular: Normal rate, regular rhythm. Grossly normal heart sounds.  Good peripheral circulation. Respiratory: Normal respiratory effort.  No retractions. Mild scattered wheeze. No rhonchi or rales. Dry intermittent cough in room with bronchospasm wheeze. Speaks in complete sentences.  Gastrointestinal: Soft and nontender.  No CVA tenderness. Musculoskeletal: Ambulatory with steady gait. No cervical, thoracic or lumbar tenderness to palpation.Mild left posterior back mild tenderness to palpation, no erythema. bilateral lower extremities nontender and no edema noted. Bilateral pedal pulses equal and easily palpated.  Neurologic:  Normal speech and language. No gait instability. Skin:  Skin appears warm, dry and intact. No rash noted. Psychiatric: Mood and affect  are normal. Speech and behavior are normal.  ___________________________________________   LABS (all labs ordered are listed, but only abnormal results are displayed)  Labs Reviewed  CBC WITH DIFFERENTIAL/PLATELET - Abnormal; Notable for the following:       Result Value   WBC 3.4 (*)    All other components within normal limits  BASIC METABOLIC PANEL - Abnormal; Notable for the following:    Calcium 8.8 (*)    All other components within normal limits  INFLUENZA PANEL BY PCR (TYPE A & B) - Abnormal; Notable for the following:    Influenza B By PCR POSITIVE (*)    All other components within normal limits  MAGNESIUM  FIBRIN DERIVATIVES D-DIMER [facility] ONLY)   RADIOLOGY  Dg Chest 2 View  Result Date: 09/08/2016 CLINICAL DATA:  Cough and shortness-of-breath 2 weeks. History of  asthma. EXAM: CHEST  2 VIEW COMPARISON:  None. FINDINGS: Lungs are adequately inflated without focal consolidation or effusion. Mild flattening of the hemidiaphragms on the lateral film. No pneumothorax. Cardiomediastinal silhouette is within normal minimal degenerate change of the spine. Surgical anchors over the right humeral head. IMPRESSION: No active cardiopulmonary disease. Electronically Signed   By: Marin Olp M.D.   On: 09/08/2016 09:50   ____________________________________________   PROCEDURES Procedures    INITIAL IMPRESSION / ASSESSMENT AND PLAN / ED COURSE  Pertinent labs & imaging results that were available during my care of the patient were reviewed by me and considered in my medical decision making (see chart for details).  Well-appearing patient. No acute distress. One week of cough and congestion symptoms, with reported feeling that her asthma is flaring up. Reports history of wheezing over the last week. Will evaluate chest x-ray. Patient further expresses concern of feeling tired and requests lab work. Patient does also report some pain to left back that is only present with coughing and deep inhalation, Wells score for PE negative, patient does report family history of von Willebrand, counseled evaluation of d-dimer and patient request d-dimer evaluation. DuoNeb given. Patient also requests influenza evaluation.  After DuoNeb, patient reevaluated. Patient reports she is feeling much improved and feeling much better. Lungs clear throughout, intermittent cough without bronchospasm. Patient states that she is breathing better, coughing has decreased and denies any pain with breathing. Patient reports that she does have occasional cough and when she does cough she feels some pain in her left back, but patient further states she is now feeling that pain with any kind of movement and overhead stretching. Denies any pain currently. Denies chest pain. Awaiting labs.  1150: Labs  reviewed and discussed with patient. Labs unremarkable, except influenza B positive. D-dimer negative. Discussed in detail with patient due to timeframe, no indication for Tamiflu. We recommend for patient to continue home medication including amoxicillin as well as Tessalon Perles. Will treat patient with oral prednisone, and as patient reports feeling more benefit from albuterol nebulizer compared to inhaler, Rx given for albuterol nebs. Patient reports that her grandson has a nebulizer machine at home that she can use. Encourage PCP follow-up this coming week. Encouraged rest, fluids and supportive care. Work note given for today and tomorrow. Discussed strict follow-up and return parameters.   Discussed follow up and return parameters including no resolution or any worsening concerns. Patient verbalized understanding and agreed to plan.   ____________________________________________   FINAL CLINICAL IMPRESSION(S) / ED DIAGNOSES  Final diagnoses:  Influenza B  Asthmatic bronchitis with acute exacerbation, unspecified asthma severity, unspecified whether  persistent     New Prescriptions   ALBUTEROL (PROVENTIL) (2.5 MG/3ML) 0.083% NEBULIZER SOLUTION    Take 3 mLs (2.5 mg total) by nebulization every 6 (six) hours as needed for wheezing or shortness of breath.   PREDNISONE (DELTASONE) 20 MG TABLET    Take 40 mg orally daily x 3 days, then 20 mg orally daily x 2 days.    Note: This dictation was prepared with Dragon dictation along with smaller phrase technology. Any transcriptional errors that result from this process are unintentional.         Marylene Land, NP 09/08/16 [place], MD 09/08/16 [phone]

## 2016-09-08 NOTE — Discharge Instructions (Signed)
Take medication as prescribed. Rest. Drink plenty of fluids.  ° °Follow up with your primary care physician this week. Return to Urgent care for new or worsening concerns.  ° °

## 2016-09-16 ENCOUNTER — Encounter: Payer: Self-pay | Admitting: Gynecology

## 2016-09-16 ENCOUNTER — Ambulatory Visit
Admission: EM | Admit: 2016-09-16 | Discharge: 2016-09-16 | Disposition: A | Discharge status: Home / Self Care | Payer: Medicare HMO | Attending: Family Medicine | Admitting: Family Medicine

## 2016-09-16 DIAGNOSIS — R6889 Other general symptoms and signs: Secondary | ICD-10-CM

## 2016-09-16 NOTE — ED Provider Notes (Signed)
[facility]    CSN: [id number] Arrival date & time: 09/16/16  6734     History   Chief Complaint Chief Complaint  Patient presents with  . Influenza    HPI Maria Gardner is a 66 y.o. female.   Patient is a 66 year old white female with interesting presentation. 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 She's has a history of asthma as well. She was seen basically weak for last early in the week by her PCP because she was not feeling well. She was given amoxicillin breathing treatment and had an exacerbation of her asthma. She was then later seen at [facility] on Saturday because increased cough congestion, malaise and aching. She denies any fever. Chest x-ray Saturday the 17th was negative she states that she was tested for flu after her assistance and found to be positive for type B. She reports being very sensitive to multiple medications including concern with the influenza vaccine so the decision was made not to place her on Tamiflu. She reports she was told she'll be sick for about 3 weeks. She has had bronchospasm and wheezing and she had wheezing up until Friday and that was the last day of her aerosol treatment. Her concern is when she uses the nebulizer she becomes she puts it height and that she crashes so she is not really a fan of using nebulizer and that she actually has to. The discomfort she's had a chest is gone but she still doesn't feel completely like herself. She felt was concerned that she may still have the flu bug may transmitted to her clients and she was make sure she does not transmit anything to her clients so she came in to have a flu test this make sure she was clear of the flu. She's had a history of asthma deficiency. Surgeries included abdominal hysterectomy, tonsillectomy and shoulder repair. She uses multiple inhalers Atrovent and albuterol for her asthma exacerbation. She states  several members of the family have had the flu in the household she's a former smoker and she is allergic to multiple antibiotics ammonia Compazine prednisone morphine and terbutaline   The history is provided by the patient. No language interpreter was used.  Influenza  Presenting symptoms: cough, fatigue and shortness of breath   Severity:  Moderate Onset quality:  Unable to specify Progression:  Waxing and waning Chronicity:  New Relieved by:  Nebulizer treatments Worsened by:  Nothing Associated symptoms: chills and decreased physical activity     Past Medical History:  Diagnosis Date  . Asthma   . Iron deficiency     There are no active problems to display for this patient.   Past Surgical History:  Procedure Laterality Date  . ABDOMINAL HYSTERECTOMY    . SHOULDER ARTHROSCOPY WITH OPEN ROTATOR CUFF REPAIR Right 08/24/2015   Procedure: SHOULDER ARTHROSCOPY WITH OPEN ROTATOR CUFF REPAIR;  Surgeon: Thornton Park, MD;  Location: [facility] ORS;  Service: Orthopedics;  Laterality: Right;  . TONSILLECTOMY      OB History    No data available       Home Medications    Prior to Admission medications   Medication Sig Start Date End Date Taking? Authorizing Provider  acetaminophen (TYLENOL) 325 MG tablet Take 650 mg by mouth every 6 (six) hours as needed.   Yes Historical Provider, MD  albuterol (PROVENTIL HFA;VENTOLIN HFA) 108 (90 Base) MCG/ACT inhaler Inhale 1 puff  into the lungs as needed for wheezing or shortness of breath.   Yes Historical Provider, MD  albuterol (PROVENTIL) (2.5 MG/3ML) 0.083% nebulizer solution Take 3 mLs (2.5 mg total) by nebulization every 6 (six) hours as needed for wheezing or shortness of breath. 09/08/16  Yes Marylene Land, NP  ALBUTEROL IN Inhale 1 puff into the lungs as needed (for shortness of breath).   Yes Historical Provider, MD  ipratropium (ATROVENT HFA) 17 MCG/ACT inhaler Inhale 1 puff into the lungs as needed for wheezing.   Yes Historical  Provider, MD  ondansetron (ZOFRAN) 4 MG tablet Take 1 tablet (4 mg total) by mouth every 8 (eight) hours as needed for nausea or vomiting. 08/24/15  Yes Thornton Park, MD  oxyCODONE (OXY IR/ROXICODONE) 5 MG immediate release tablet Take 1-2 tablets (5-10 mg total) by mouth every 4 (four) hours as needed for severe pain. 08/24/15  Yes Thornton Park, MD  predniSONE (DELTASONE) 20 MG tablet Take 40 mg orally daily x 3 days, then 20 mg orally daily x 2 days. 09/08/16   Marylene Land, NP    Family History Family History  Problem Relation Age of Onset  . Breast cancer Neg Hx     Social History Social History  Substance Use Topics  . Smoking status: Former Smoker    Quit date: 08/04/1973  . Smokeless tobacco: Never Used  . Alcohol use Yes     Comment: 1 glass of wine 2 times a year     Allergies   Ammonia; Compazine [prochlorperazine edisylate]; Morphine and related; Prednisolone; and Terbinafine and related   Review of Systems Review of Systems  Constitutional: Positive for chills and fatigue.  Respiratory: Positive for cough, shortness of breath and wheezing.   Neurological: Positive for weakness.     Physical Exam Triage Vital Signs ED Triage Vitals [09/16/16 0831]  Enc Vitals Group     BP 121/66     Pulse Rate 83     Resp 16     Temp 98.1 F (36.7 C)     Temp Source Oral     SpO2 96 %     Weight      Height      Head Circumference      Peak Flow      Pain Score      Pain Loc      Pain Edu?      Excl. in [place]?    No data found.   Updated Vital Signs BP 121/66 (BP Location: Left Arm)   Pulse 83   Temp 98.1 F (36.7 C) (Oral)   Resp 16   SpO2 96%   Visual Acuity Right Eye Distance:   Left Eye Distance:   Bilateral Distance:    Right Eye Near:   Left Eye Near:    Bilateral Near:     Physical Exam  Constitutional: She appears well-developed and well-nourished.  Non-toxic appearance. She does not have a sickly appearance. She does not appear ill. No  distress.  HENT:  Head: Normocephalic and atraumatic.  Right Ear: Hearing, tympanic membrane, external ear and ear canal normal.  Left Ear: Hearing, tympanic membrane, external ear and ear canal normal.  Nose: Nose normal. No mucosal edema. Right sinus exhibits no maxillary sinus tenderness and no frontal sinus tenderness. Left sinus exhibits no maxillary sinus tenderness and no frontal sinus tenderness.  Mouth/Throat: Uvula is midline, oropharynx is clear and moist and mucous membranes are normal.  Eyes: Conjunctivae are normal. Pupils  are equal, round, and reactive to light.  Neck: Normal range of motion. Neck supple.  Cardiovascular: Normal rate.   Pulmonary/Chest: Effort normal and breath sounds normal. No respiratory distress. She has no wheezes.  Musculoskeletal: Normal range of motion.  Neurological: She is alert.  Skin: Skin is warm.  Psychiatric: She has a normal mood and affect.  Vitals reviewed.    UC Treatments / Results  Labs (all labs ordered are listed, but only abnormal results are displayed) Labs Reviewed - No data to display  EKG  EKG Interpretation None       Radiology No results found.  Procedures Procedures (including critical care time)  Medications Ordered in UC Medications - No data to display   Initial Impression / Assessment and Plan / UC Course  I have reviewed the triage vital signs and the nursing notes.  Pertinent labs & imaging results that were available during my care of the patient were reviewed by me and considered in my medical decision making (see chart for details).     I spent extensive time with patient explained to her this point time she seems stable emotionally going to mask. She's not really coughing any more she's not had any wheezing 48 hours even though she is does normally have a fever she.heading chest chills or the myalgias either the one positive component is fatigue and some this may be deconditioning over the last 10  days of inactivity. 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 Explained to her she can always get another type of flu since she does not get the flu vaccination but that often viral illnesses are actually more contagious before the symptoms started and usually once the main symptoms are cleared fever coughing persisted longer felt to be infectious seems to be comfortable with that explanation and will go back to work tomorrow. She declined note for release  Final Clinical Impressions(s) / UC Diagnoses   Final diagnoses:  Flu-like symptoms    New Prescriptions Discharge Medication List as of 09/16/2016  8:57 AM      Note: This dictation was prepared with Dragon dictation along with smaller phrase technology. Any transcriptional errors that result from this process are unintentional.   Frederich Cha, MD 09/16/16 [phone]

## 2016-09-16 NOTE — ED Triage Notes (Signed)
Patient c/o tested for the Flu x 1 week . Per patient wanted to known if she is contagious to go work. Per patient not feeling well to return to work.

## 2017-02-04 ENCOUNTER — Encounter
Admission: RE | Disposition: A | Discharge status: Home / Self Care | Payer: Self-pay | Source: Ambulatory Visit | Attending: Gastroenterology

## 2017-02-04 ENCOUNTER — Ambulatory Visit: Payer: Medicare HMO | Admitting: Anesthesiology

## 2017-02-04 ENCOUNTER — Ambulatory Visit
Admission: RE | Admit: 2017-02-04 | Discharge: 2017-02-04 | Disposition: A | Discharge status: Home / Self Care | Payer: Medicare HMO | Source: Ambulatory Visit | Attending: Gastroenterology | Admitting: Gastroenterology

## 2017-02-04 DIAGNOSIS — R194 Change in bowel habit: Secondary | ICD-10-CM | POA: Diagnosis present

## 2017-02-04 DIAGNOSIS — D120 Benign neoplasm of cecum: Secondary | ICD-10-CM | POA: Diagnosis not present

## 2017-02-04 DIAGNOSIS — D121 Benign neoplasm of appendix: Secondary | ICD-10-CM | POA: Insufficient documentation

## 2017-02-04 DIAGNOSIS — Z87891 Personal history of nicotine dependence: Secondary | ICD-10-CM | POA: Insufficient documentation

## 2017-02-04 DIAGNOSIS — K5900 Constipation, unspecified: Secondary | ICD-10-CM | POA: Insufficient documentation

## 2017-02-04 DIAGNOSIS — K5730 Diverticulosis of large intestine without perforation or abscess without bleeding: Secondary | ICD-10-CM | POA: Diagnosis not present

## 2017-02-04 DIAGNOSIS — J45909 Unspecified asthma, uncomplicated: Secondary | ICD-10-CM | POA: Diagnosis not present

## 2017-02-04 HISTORY — PX: COLONOSCOPY WITH PROPOFOL: SHX5780

## 2017-02-04 SURGERY — COLONOSCOPY WITH PROPOFOL
Anesthesia: General

## 2017-02-04 MED ORDER — LIDOCAINE HCL (CARDIAC) 20 MG/ML IV SOLN
INTRAVENOUS | Status: DC | PRN
  Administered 2017-02-04: 50 mg via INTRAVENOUS

## 2017-02-04 MED ORDER — PHENYLEPHRINE HCL 10 MG/ML IJ SOLN
INTRAMUSCULAR | Status: DC | PRN
  Administered 2017-02-04: 100 ug via INTRAVENOUS

## 2017-02-04 MED ORDER — PROPOFOL 500 MG/50ML IV EMUL
INTRAVENOUS | Status: AC
  Filled 2017-02-04: qty 50

## 2017-02-04 MED ORDER — FENTANYL CITRATE (PF) 100 MCG/2ML IJ SOLN
INTRAMUSCULAR | Status: AC
  Filled 2017-02-04: qty 2

## 2017-02-04 MED ORDER — MIDAZOLAM HCL 2 MG/2ML IJ SOLN
INTRAMUSCULAR | Status: AC
  Filled 2017-02-04: qty 2

## 2017-02-04 MED ORDER — FENTANYL CITRATE (PF) 100 MCG/2ML IJ SOLN
INTRAMUSCULAR | Status: DC | PRN
  Administered 2017-02-04: 25 ug via INTRAVENOUS

## 2017-02-04 MED ORDER — SODIUM CHLORIDE 0.9 % IV SOLN
INTRAVENOUS | Status: DC
  Administered 2017-02-04 (×2): via INTRAVENOUS

## 2017-02-04 MED ORDER — PROPOFOL 500 MG/50ML IV EMUL
INTRAVENOUS | Status: DC | PRN
  Administered 2017-02-04: 150 ug/kg/min via INTRAVENOUS

## 2017-02-04 MED ORDER — MIDAZOLAM HCL 2 MG/2ML IJ SOLN
INTRAMUSCULAR | Status: DC | PRN
  Administered 2017-02-04: 2 mg via INTRAVENOUS

## 2017-02-04 MED ORDER — PROPOFOL 10 MG/ML IV BOLUS
INTRAVENOUS | Status: DC | PRN
  Administered 2017-02-04: 50 mg via INTRAVENOUS

## 2017-02-04 NOTE — Anesthesia Post-op Follow-up Note (Signed)
Anesthesia QCDR form completed.        

## 2017-02-04 NOTE — Anesthesia Preprocedure Evaluation (Signed)
Anesthesia Evaluation  Patient identified by MRN, date of birth, ID band Patient awake    Reviewed: Allergy & Precautions, NPO status , Patient's Chart, lab work & pertinent test results  History of Anesthesia Complications Negative for: history of anesthetic complications  Airway Mallampati: II       Dental   Pulmonary asthma , former smoker,           Cardiovascular negative cardio ROS       Neuro/Psych negative neurological ROS     GI/Hepatic negative GI ROS, Neg liver ROS,   Endo/Other  negative endocrine ROS  Renal/GU negative Renal ROS     Musculoskeletal   Abdominal   Peds  Hematology   Anesthesia Other Findings   Reproductive/Obstetrics                             Anesthesia Physical Anesthesia Plan  ASA: II  Anesthesia Plan: General   Post-op Pain Management:    Induction: Intravenous  PONV Risk Score and Plan:   Airway Management Planned:   Additional Equipment:   Intra-op Plan:   Post-operative Plan:   Informed Consent: I have reviewed the patients History and Physical, chart, labs and discussed the procedure including the risks, benefits and alternatives for the proposed anesthesia with the patient or authorized representative who has indicated his/her understanding and acceptance.     Plan Discussed with:   Anesthesia Plan Comments:         Anesthesia Quick Evaluation

## 2017-02-04 NOTE — Op Note (Addendum)
[facility] Patient Name: Maria Gardner Procedure Date: 02/04/2017 3:32 PM MRN: [id number] Account #: [id number] Date of Birth: 14-Mar-1951 Admit Type: Outpatient Age: 66 Room: [facility] [place] ENDO ROOM 1 Gender: Female Note Status: Finalized Procedure:            Colonoscopy Indications:          Change in bowel habits Providers:            Lollie Sails, MD Referring MD:         Sofie Hartigan (Referring MD) Medicines:            Monitored Anesthesia Care Complications:        No immediate complications. Procedure:            Pre-Anesthesia Assessment:                       - ASA Grade Assessment: III - A patient with severe                        systemic disease.                       After obtaining informed consent, the colonoscope was                        passed under direct vision. Throughout the procedure,                        the patient's blood pressure, pulse, and oxygen                        saturations were monitored continuously. The                        Colonoscope was introduced through the anus and                        advanced to the the cecum, identified by appendiceal                        orifice and ileocecal valve. The colonoscopy was                        unusually difficult due to multiple diverticula in the                        colon, poor bowel prep and a tortuous colon. Successful                        completion of the procedure was aided by changing the                        patient to a supine position, changing the patient to a                        prone position and using manual pressure. The quality                        of the bowel preparation was fair. The patient  tolerated the procedure well. Findings:      Many small and large-mouthed diverticula were found in the sigmoid       colon, descending colon, transverse colon and ascending colon.      A 4 mm polyp was  found in the appendiceal orifice. The polyp was       sessile. The polyp was removed with a piecemeal technique using a cold       biopsy forceps. Resection and retrieval were complete.      A 2 mm polyp was found in the ileocecal valve. The polyp was sessile.       The polyp was removed with a cold biopsy forceps. Resection and       retrieval were complete.      The retroflexed view of the distal rectum and anal verge was normal and       showed no anal or rectal abnormalities.      The digital rectal exam was normal. Impression:           - Preparation of the colon was fair.                       - Diverticulosis in the sigmoid colon, in the                        descending colon, in the transverse colon and in the                        ascending colon.                       - One 4 mm polyp at the appendiceal orifice, removed                        piecemeal using a cold biopsy forceps. Resected and                        retrieved.                       - One 2 mm polyp at the ileocecal valve, removed with a                        cold biopsy forceps. Resected and retrieved.                       - The distal rectum and anal verge are normal on                        retroflexion view. Recommendation:       - Discharge patient to home.                       - Low residue diet today, then advance as tolerated to                        advance diet as tolerated. Procedure Code(s):    --- Professional ---                       [phone], Colonoscopy, flexible; with biopsy, single or  multiple Diagnosis Code(s):    --- Professional ---                       D12.1, Benign neoplasm of appendix                       D12.0, Benign neoplasm of cecum                       R19.4, Change in bowel habit                       K57.30, Diverticulosis of large intestine without                        perforation or abscess without bleeding CPT copyright 2016 American Medical  Association. All rights reserved. The codes documented in this report are preliminary and upon coder review may  be revised to meet current compliance requirements. Lollie Sails, MD 02/04/2017 4:20:52 PM This report has been signed electronically. Number of Addenda: 0 Note Initiated On: 02/04/2017 3:32 PM Scope Withdrawal Time: 0 hours 18 minutes 25 seconds  Total Procedure Duration: 0 hours 36 minutes 30 seconds       [facility]

## 2017-02-04 NOTE — Transfer of Care (Signed)
Immediate Anesthesia Transfer of Care Note  Patient: Maria Gardner  Procedure(s) Performed: Procedure(s): COLONOSCOPY WITH PROPOFOL (N/A)  Patient Location: Endoscopy Unit  Anesthesia Type:General  Level of Consciousness: drowsy and patient cooperative  Airway & Oxygen Therapy: Patient Spontanous Breathing and Patient connected to nasal cannula oxygen  Post-op Assessment: Report given to RN and Post -op Vital signs reviewed and stable  Post vital signs: Reviewed and stable  Last Vitals:  Vitals:   02/04/17 1418  BP: (!) 147/66  Pulse: 74  Resp: 18  Temp: (!) 35.9 C  SpO2: 100%    Last Pain:  Vitals:   02/04/17 1418  TempSrc: Tympanic         Complications: No apparent anesthesia complications

## 2017-02-04 NOTE — H&P (Signed)
Outpatient short stay form Pre-procedure 02/04/2017 3:34 PM Maria Sails MD  Primary Physician: Dr. Thereasa Distance  Reason for visit:  Colonoscopy  History of present illness:  Patient is a 65 year old female presenting today as above. She has a history of a change of bowel habits with a week or so of severe constipation for several months ago. She states that she is feeling much better from that and her stool and bowel habits are pretty much back to normal this point. At that time she also was admitted to the hospital with a upper respiratory tract infection and the flu. She tolerated her prep well. She takes no aspirin or blood thinning agents. She takes no NSAIDs.    Current Facility-Administered Medications:  .  0.9 %  sodium chloride infusion, , Intravenous, Continuous, Maria Sails, MD, Last Rate: 20 mL/hr at 02/04/17 1434  Prescriptions Prior to Admission  Medication Sig Dispense Refill Last Dose  . albuterol (PROVENTIL HFA;VENTOLIN HFA) 108 (90 Base) MCG/ACT inhaler Inhale 1 puff into the lungs as needed for wheezing or shortness of breath.   Past Month at Unknown time  . ALBUTEROL IN Inhale 1 puff into the lungs as needed (for shortness of breath).   Past Month at Unknown time  . ipratropium (ATROVENT HFA) 17 MCG/ACT inhaler Inhale 1 puff into the lungs as needed for wheezing.   Past Month at Unknown time  . acetaminophen (TYLENOL) 325 MG tablet Take 650 mg by mouth every 6 (six) hours as needed.   Past Month at Unknown time  . albuterol (PROVENTIL) (2.5 MG/3ML) 0.083% nebulizer solution Take 3 mLs (2.5 mg total) by nebulization every 6 (six) hours as needed for wheezing or shortness of breath. 60 mL 0   . ondansetron (ZOFRAN) 4 MG tablet Take 1 tablet (4 mg total) by mouth every 8 (eight) hours as needed for nausea or vomiting. 30 tablet 0   . oxyCODONE (OXY IR/ROXICODONE) 5 MG immediate release tablet Take 1-2 tablets (5-10 mg total) by mouth every 4 (four) hours as  needed for severe pain. 40 tablet 0   . predniSONE (DELTASONE) 20 MG tablet Take 40 mg orally daily x 3 days, then 20 mg orally daily x 2 days. 8 tablet 0      Allergies  Allergen Reactions  . Ammonia Other (See Comments)    sends pt into an asthma attack  . Compazine [Prochlorperazine Edisylate] Other (See Comments)    Muscular contractions on face and hands  . Morphine And Related Other (See Comments)    Doubled over in pain and passing out  . Prednisolone Hives  . Terbinafine And Related Other (See Comments)    Stroke  Symptoms, loss of feeling in face and swelling, weakness in right side of the body.     Past Medical History:  Diagnosis Date  . Asthma   . Iron deficiency     Review of systems:      Physical Exam    Heart and lungs: Regular rate and rhythm without rub or gallop, lungs are bilaterally clear.    HEENT: Normocephalic atraumatic eyes are anicteric    Other:     Pertinant exam for procedure: Soft nontender nondistended bowel sounds positive normoactive.    Planned proceedures: Colonoscopy and indicated procedures. I have discussed the risks benefits and complications of procedures to include not limited to bleeding, infection, perforation and the risk of sedation and the patient wishes to proceed.    Maria Sails,  MD Gastroenterology 02/04/2017  3:34 PM

## 2017-02-05 ENCOUNTER — Encounter: Payer: Self-pay | Admitting: Gastroenterology

## 2017-02-06 LAB — SURGICAL PATHOLOGY

## 2017-02-14 NOTE — Anesthesia Postprocedure Evaluation (Signed)
Anesthesia Post Note  Patient: Maria Gardner  Procedure(s) Performed: Procedure(s) (LRB): COLONOSCOPY WITH PROPOFOL (N/A)  Patient location during evaluation: PACU Anesthesia Type: General Level of consciousness: awake and alert Pain management: pain level controlled Vital Signs Assessment: post-procedure vital signs reviewed and stable Respiratory status: spontaneous breathing, nonlabored ventilation, respiratory function stable and patient connected to nasal cannula oxygen Cardiovascular status: blood pressure returned to baseline and stable Postop Assessment: no signs of nausea or vomiting Anesthetic complications: no     Last Vitals:  Vitals:   02/04/17 1642 02/04/17 1652  BP: 114/73 117/74  Pulse: 73 72  Resp: 16 16  Temp:    SpO2: 100% 98%    Last Pain:  Vitals:   02/05/17 0729  TempSrc:   PainSc: 0-No pain                 Molli Barrows

## 2019-04-16 ENCOUNTER — Other Ambulatory Visit: Payer: Self-pay

## 2019-04-16 DIAGNOSIS — Z20822 Contact with and (suspected) exposure to covid-19: Secondary | ICD-10-CM

## 2019-04-18 LAB — NOVEL CORONAVIRUS, NAA: SARS-CoV-2, NAA: NOT DETECTED

## 2019-10-17 ENCOUNTER — Other Ambulatory Visit: Payer: Self-pay

## 2019-10-17 ENCOUNTER — Emergency Department: Payer: Medicare HMO

## 2019-10-17 ENCOUNTER — Emergency Department
Admission: EM | Admit: 2019-10-17 | Discharge: 2019-10-18 | Disposition: A | Discharge status: Home / Self Care | Payer: Medicare HMO | Attending: Emergency Medicine | Admitting: Emergency Medicine

## 2019-10-17 DIAGNOSIS — E860 Dehydration: Secondary | ICD-10-CM | POA: Diagnosis not present

## 2019-10-17 DIAGNOSIS — U071 COVID-19: Secondary | ICD-10-CM | POA: Diagnosis not present

## 2019-10-17 DIAGNOSIS — R531 Weakness: Secondary | ICD-10-CM

## 2019-10-17 DIAGNOSIS — Z87891 Personal history of nicotine dependence: Secondary | ICD-10-CM | POA: Diagnosis not present

## 2019-10-17 DIAGNOSIS — J45909 Unspecified asthma, uncomplicated: Secondary | ICD-10-CM | POA: Insufficient documentation

## 2019-10-17 DIAGNOSIS — R5383 Other fatigue: Secondary | ICD-10-CM | POA: Diagnosis present

## 2019-10-17 DIAGNOSIS — Z79899 Other long term (current) drug therapy: Secondary | ICD-10-CM | POA: Diagnosis not present

## 2019-10-17 DIAGNOSIS — J188 Other pneumonia, unspecified organism: Secondary | ICD-10-CM | POA: Diagnosis not present

## 2019-10-17 LAB — CBC
HCT: 40.3 % (ref 36.0–46.0)
Hemoglobin: 13.4 g/dL (ref 12.0–15.0)
MCH: 30.4 pg (ref 26.0–34.0)
MCHC: 33.3 g/dL (ref 30.0–36.0)
MCV: 91.4 fL (ref 80.0–100.0)
Platelets: 165 10*3/uL (ref 150–400)
RBC: 4.41 MIL/uL (ref 3.87–5.11)
RDW: 12.6 % (ref 11.5–15.5)
WBC: 3 10*3/uL — ABNORMAL LOW (ref 4.0–10.5)
nRBC: 0 % (ref 0.0–0.2)

## 2019-10-17 LAB — LACTATE DEHYDROGENASE: LDH: 76 U/L — ABNORMAL LOW (ref 98–192)

## 2019-10-17 LAB — FERRITIN: Ferritin: 70 ng/mL (ref 11–307)

## 2019-10-17 LAB — GLUCOSE, CAPILLARY: Glucose-Capillary: 95 mg/dL (ref 70–99)

## 2019-10-17 MED ORDER — SODIUM CHLORIDE 0.9 % IV BOLUS
1000.0000 mL | Freq: Once | INTRAVENOUS | Status: AC
  Administered 2019-10-17: 23:00:00 1000 mL via INTRAVENOUS

## 2019-10-17 NOTE — ED Notes (Addendum)
Date and time results received: 10/17/19 10:36 PM  Test: Calcium <4, Potassium <2  Name of Provider Notified: Dr. Joan Mayans  Orders Received? Or Actions Taken?: Orders Received - See Orders for details - repeat ordered

## 2019-10-17 NOTE — ED Triage Notes (Signed)
Pt brought to ED via [facility] from home. Pt covid +. Reports weakness and dehydration.

## 2019-10-17 NOTE — ED Notes (Signed)
Near syncopal in triage, pale. Placed in tx room in trendelenburg.

## 2019-10-17 NOTE — ED Notes (Signed)
Patient to waiting room via wheelchair by EMS.  Per EMS patient with complaint of "not feeling good".  Per EMS patient diagnosed with COVID on Thursday and wants to be evaluated because she is not feeling better.  Patient has 18g angiocath to right antecub she received NS 500 ml, heart rate 64, bp 113/86, pulse oxi 96% on room air, CBG 132.

## 2019-10-17 NOTE — ED Provider Notes (Signed)
[facility] Emergency Department Provider Note  ____________________________________________   First MD Initiated Contact with Patient 10/17/19 2204     (approximate)  I have reviewed the triage vital signs and the nursing notes.  History  Chief Complaint Weakness    HPI Maria Gardner is a 69 y.o. female history of asthma, COVID positive on 10/14/2019 who presents to the emergency department for generalized weakness, body aches, fatigue, decreased intake.  Symptoms present for 1 week, constant and progressively worsening over this time period.  Patient symptoms first started last Sunday, 4/18.  She was tested on 4/21 and was positive.  She reports significant fatigue and concern for dehydration and she has been unable to take much PO over this last week due to feeling generally unwell and lack of appetite.  Denies any significant vomiting or diarrhea.  Mild cough, feels like she is tolerating her shortness of breath decently well with her asthma medications.  Nothing seems to make her symptoms better or worse.   Past Medical Hx Past Medical History:  Diagnosis Date  . Asthma   . Iron deficiency     Problem List There are no problems to display for this patient.   Past Surgical Hx Past Surgical History:  Procedure Laterality Date  . ABDOMINAL HYSTERECTOMY    . COLONOSCOPY WITH PROPOFOL N/A 02/04/2017   Procedure: COLONOSCOPY WITH PROPOFOL;  Surgeon: Lollie Sails, MD;  Location: [facility] ENDOSCOPY;  Service: Endoscopy;  Laterality: N/A;  . SHOULDER ARTHROSCOPY WITH OPEN ROTATOR CUFF REPAIR Right 08/24/2015   Procedure: SHOULDER ARTHROSCOPY WITH OPEN ROTATOR CUFF REPAIR;  Surgeon: Thornton Park, MD;  Location: [facility] ORS;  Service: Orthopedics;  Laterality: Right;  . TONSILLECTOMY      Medications Prior to Admission medications   Medication Sig Start Date End Date Taking? Authorizing Provider  acetaminophen (TYLENOL) 325 MG tablet Take 650 mg by  mouth every 6 (six) hours as needed.    [provider]  albuterol (PROVENTIL HFA;VENTOLIN HFA) 108 (90 Base) MCG/ACT inhaler Inhale 1 puff into the lungs as needed for wheezing or shortness of breath.    [provider]  albuterol (PROVENTIL) (2.5 MG/3ML) 0.083% nebulizer solution Take 3 mLs (2.5 mg total) by nebulization every 6 (six) hours as needed for wheezing or shortness of breath. 09/08/16   Marylene Land, NP  ALBUTEROL IN Inhale 1 puff into the lungs as needed (for shortness of breath).    [provider]  ipratropium (ATROVENT HFA) 17 MCG/ACT inhaler Inhale 1 puff into the lungs as needed for wheezing.    [provider]  ondansetron (ZOFRAN) 4 MG tablet Take 1 tablet (4 mg total) by mouth every 8 (eight) hours as needed for nausea or vomiting. 08/24/15   Thornton Park, MD  oxyCODONE (OXY IR/ROXICODONE) 5 MG immediate release tablet Take 1-2 tablets (5-10 mg total) by mouth every 4 (four) hours as needed for severe pain. 08/24/15   Thornton Park, MD  predniSONE (DELTASONE) 20 MG tablet Take 40 mg orally daily x 3 days, then 20 mg orally daily x 2 days. 09/08/16   Marylene Land, NP    Allergies Ammonia, Compazine [prochlorperazine edisylate], Morphine and related, Prednisolone, and Terbinafine and related  Family Hx Family History  Problem Relation Age of Onset  . Breast cancer Neg Hx     Social Hx Social History   Tobacco Use  . Smoking status: Former Smoker    Quit date: 08/04/1973    Years since quitting:  46.2  . Smokeless tobacco: Never Used  Substance Use Topics  . Alcohol use: Yes    Comment: 1 glass of wine 2 times a year  . Drug use: No     Review of Systems  Constitutional: Positive for fatigue, body aches Eyes: Negative for visual changes. ENT: Negative for sore throat. Cardiovascular: Negative for chest pain. Respiratory: Negative for shortness of breath. Gastrointestinal: Positive for decreased intake Genitourinary:  Negative for dysuria. Musculoskeletal: Negative for leg swelling. Skin: Negative for rash. Neurological: Negative for headaches.   Physical Exam  Vital Signs: ED Triage Vitals  Enc Vitals Group     BP 10/17/19 2147 (!) 86/51     Pulse Rate 10/17/19 2147 74     Resp 10/17/19 2147 18     Temp 10/17/19 2147 98.1 F (36.7 C)     Temp Source 10/17/19 2147 Oral     SpO2 10/17/19 2147 100 %     Weight 10/17/19 2148 198 lb 6.6 oz (90 kg)     Height 10/17/19 2148 5\' 2"  (1.575 m)     Head Circumference --      Peak Flow --      Pain Score 10/17/19 2148 0     Pain Loc --      Pain Edu? --      Excl. in [place]? --     Constitutional: Alert and oriented.  Appears fatigued and tired. Head: Normocephalic. Atraumatic. Eyes: Conjunctivae clear. Sclera anicteric. Pupils equal and symmetric. Nose: No masses or lesions. No congestion or rhinorrhea. Mouth/Throat: Wearing mask.  MM dry. Neck: No stridor. Trachea midline.  Cardiovascular: Normal rate, regular rhythm. Extremities well perfused. Respiratory: Normal respiratory effort.  Oxygen decreased slightly to upper 88-89% on RA with talking, placed on 2 L nasal cannula. Gastrointestinal: Soft. Non-distended. Non-tender.  Genitourinary: Deferred. Musculoskeletal: No lower extremity edema. No deformities. Neurologic:  Normal speech and language. No gross focal or lateralizing neurologic deficits are appreciated.  Skin: Skin is warm, dry and intact. No rash noted. Psychiatric: Mood and affect are appropriate for situation.   Radiology  Personally reviewed available imaging myself.   CXR - IMPRESSION:  1. Findings consistent with multifocal bilateral pneumonia.    Procedures  Procedure(s) performed (including critical care):  Procedures   Initial Impression / Assessment and Plan / MDM / ED Course  69 y.o. female who presents to the ED for fatigue, weakness, decreased intake in the setting of COVID.  Hypotensive on arrival, improving  with fluids.  Ddx: dehydration, electrolyte abnormalities, AKI, worsening COVID  Will plan for labs, fluids, cardiac monitoring.  Anticipate admission given her notable hypertension on arrival.   _______________________________   As part of my medical decision making I have reviewed available labs, radiology tests, reviewed old records/performed chart review.   Final Clinical Impression(s) / ED Diagnosis  Final diagnoses:  Generalized weakness  COVID-19       Note:  This document was prepared using Dragon voice recognition software and may include unintentional dictation errors.   Lilia Pro., MD 10/18/19 [phone]

## 2019-10-18 LAB — BASIC METABOLIC PANEL
Anion gap: 7 (ref 5–15)
BUN: 14 mg/dL (ref 8–23)
CO2: 25 mmol/L (ref 22–32)
Calcium: 7.7 mg/dL — ABNORMAL LOW (ref 8.9–10.3)
Chloride: 105 mmol/L (ref 98–111)
Creatinine, Ser: 0.97 mg/dL (ref 0.44–1.00)
GFR calc Af Amer: >60 mL/min (ref >60)
GFR calc non Af Amer: >60 mL/min (ref >60)
Glucose, Bld: 126 mg/dL — ABNORMAL HIGH (ref 70–99)
Potassium: 4.4 mmol/L (ref 3.5–5.1)
Sodium: 137 mmol/L (ref 135–145)

## 2019-10-18 LAB — MAGNESIUM: Magnesium: 2.3 mg/dL (ref 1.7–2.4)

## 2019-10-18 LAB — C-REACTIVE PROTEIN: CRP: 0.5 mg/dL (ref <1.0)

## 2019-10-18 MED ORDER — BENZONATATE 100 MG PO CAPS
100.0000 mg | ORAL_CAPSULE | Freq: Three times a day (TID) | ORAL | 0 refills | Status: DC | PRN
Start: 2019-10-18 — End: 2021-12-02

## 2019-10-18 MED ORDER — PREDNISONE 10 MG PO TABS: ORAL_TABLET | ORAL | 0 refills | Status: DC

## 2019-10-18 MED ORDER — DEXAMETHASONE 10 MG/ML FOR PEDIATRIC ORAL USE
10.0000 mg | Freq: Once | INTRAMUSCULAR | Status: AC
  Administered 2019-10-18: 10 mg via ORAL
  Filled 2019-10-18: qty 1

## 2019-10-18 MED ORDER — HYDROCODONE-HOMATROPINE 5-1.5 MG/5ML PO SYRP: 5.0000 mL | ORAL_SOLUTION | Freq: Four times a day (QID) | ORAL | 0 refills | Status: DC | PRN

## 2019-10-18 NOTE — ED Provider Notes (Addendum)
-----------------------------------------   1:48 AM on 10/18/2019 -----------------------------------------  Assumed care from Dr. Joan Mayans.  In short, Maria Gardner is a 69 y.o. female with a chief complaint of COVID-19 related symptoms including generalized weakness and possible dehydration.  Refer to the original H&P for additional details.  The patient feels a bit better after IV fluids. Her blood pressure went from 86/51 initially to a most recent pressure of 140/76 but she has been consistently in the [id number] systolic or higher. She was put on 2 L of oxygen by nasal cannula initially for her comfort but I have turned that off as a trial. She reports that she has a history of asthma and that she has been coughing a a lot and gets tired faster. She has been trying to hydrate herself with Pedialyte and oral rehydration solutions but it has not been working.  Her lab work was reassuring. The initial metabolic panel was apparently a bad blood sample or a lab error. The repeat was reassuring with no evidence of renal dysfunction or electrolyte abnormality. Vital signs have been stable and within normal limits as previously described and her lab work including COVID-19 inflammatory markers is generally reassuring. She has multifocal pneumonia on her chest x-ray consistent with COVID-19.  I offered to admit the patient or send her home based on her preference and I explained the 2 possibilities. She said that she would prefer to go home if possible. I suggested we try a trial of ambulation with a pulse oximeter to see if she becomes hypoxemic. If she does not, I will discharge her home for close outpatient follow-up and with standard return precautions. I also discussed with her the possibility of monoclonal antibody treatment and if she goes home I will give her the contact information for the infusion clinic so that she can follow-up on Monday for possible MAB treatment. She understands and agrees with the  current plan.   ----------------------------------------- 2:43 AM on 10/18/2019 -----------------------------------------  The patient ambulated for a substantial distance and did not develop any hypoxemia and did not become particularly short of breath.  She is still comfortable with the plan to go home.  I gave her prescriptions for a prednisone taper given her history of asthma and her COVID-19 multifocal pneumonia, Tessalon Perles, and cough syrup and she will follow up as an outpatient with the infusion clinic as well as with her doctor.  She knows to return to the emergency department if she develops new or worsening symptoms.    ----------------------------------------- 3:53 AM on 10/18/2019 -----------------------------------------  Of note, the patient refused the prednisone because she says she is allergic to it.  Although I think this is very unlikely, it is theoretically possible that she could be allergic to one of the components of the tablet.  I think she would benefit from some steroids given the multifocal pneumonia and her asthma history, so as an alternative I am giving her a dose of Decadron 10 mg by mouth.  Hinda Kehr, MD 10/18/19 [id number]    Hinda Kehr, MD 10/18/19 [phone]

## 2019-10-18 NOTE — Discharge Instructions (Addendum)
As we discussed, your lab work was reassuring tonight and fortunately you do not have an oxygen requirement.  We offered to admit you to the hospital but respect your preference to go home since there is not a clear indication you need to stay at this time.  Your blood pressure improved significantly after getting some IV fluids.  I encourage you to read through the included information and do your best to stay hydrated with Gatorade, Pedialyte, or other rehydration solution.  I also prescribed several medications that may help manage your symptoms.  Please take the full course of steroids as prescribed which should help with your breathing and continue to use your breathing treatments.  I strongly encourage you to call the number listed for the [place] infusion clinic and leave a message with all the information they ask (including your [place] medical record number).  They may be able to set you up with an appointment for monoclonal antibody treatment for COVID-19, and I strongly encourage you to take advantage of this opportunity if they determined you are an appropriate candidate for treatment.    Return to the emergency department if you develop new or worsening symptoms that concern you.

## 2019-10-18 NOTE — ED Notes (Signed)
Writer ambulated patient in room. O2 states did not drop below 93%

## 2020-08-01 IMAGING — DX DG CHEST 1V PORT
1 series · 1 of 1 positions shown · non-contrast
Comparison: 09/08/2016

CLINICAL DATA: [id number] positive

EXAM:
PORTABLE CHEST 1 VIEW

[chest ap]
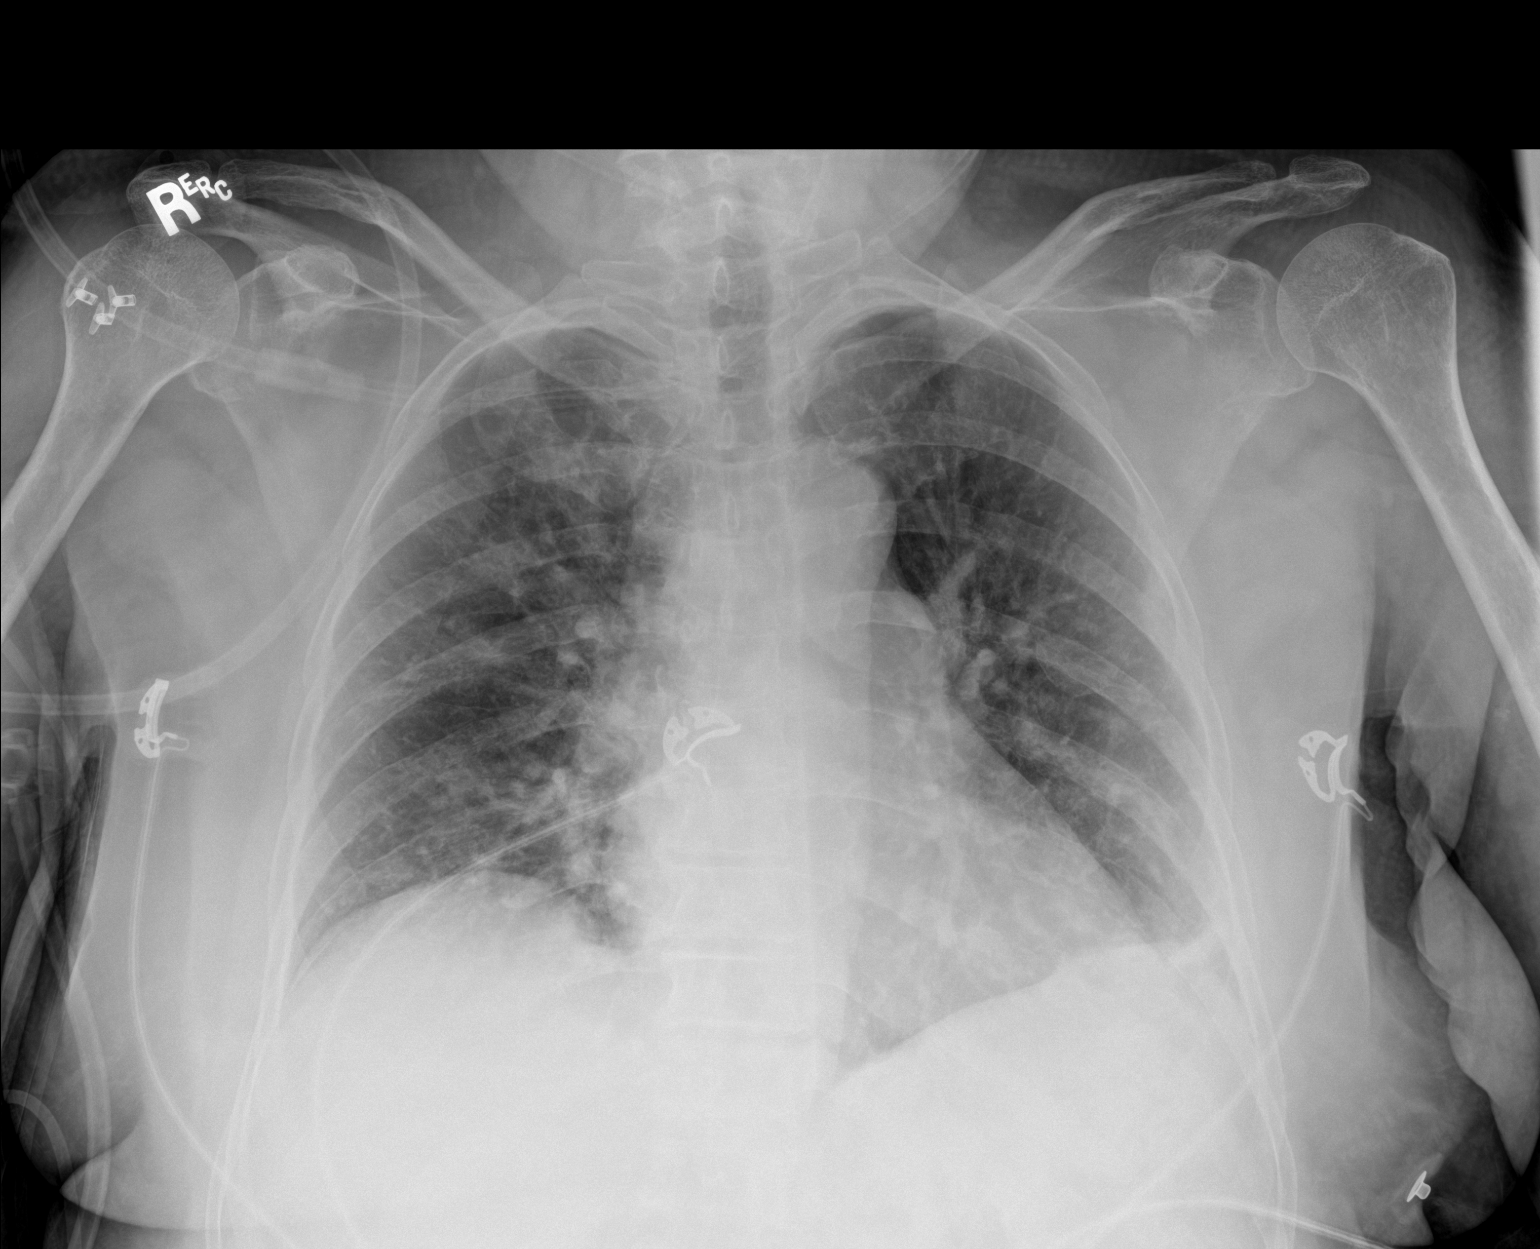

[1 of 1 positions shown; findings below may reference images not displayed]

FINDINGS: Single frontal view of the chest demonstrates an unremarkable
cardiac silhouette. There is chronic central vascular congestion.
Patchy bilateral ground-glass airspace disease without effusion or
pneumothorax. No acute bony abnormality.
IMPRESSION: 1. Findings consistent with multifocal bilateral pneumonia.

## 2021-09-18 ENCOUNTER — Other Ambulatory Visit: Payer: Self-pay

## 2021-09-18 ENCOUNTER — Ambulatory Visit
Admission: EM | Admit: 2021-09-18 | Discharge: 2021-09-18 | Disposition: A | Discharge status: Home / Self Care | Payer: Medicare HMO

## 2021-09-18 DIAGNOSIS — R051 Acute cough: Secondary | ICD-10-CM | POA: Diagnosis not present

## 2021-09-18 DIAGNOSIS — J069 Acute upper respiratory infection, unspecified: Secondary | ICD-10-CM | POA: Diagnosis not present

## 2021-09-18 DIAGNOSIS — R0981 Nasal congestion: Secondary | ICD-10-CM | POA: Diagnosis not present

## 2021-09-18 NOTE — ED Triage Notes (Signed)
Pt c/o possible sinus infection x3 days. Productive cough, sinus headache, nasal congestion, green/yellow nasal drainage. ? ?Pt took a home covid test on saturday and it was negative. Pt wants to treat the symptoms and wants an antibiotic. Pt states that she has to wait 2 weeks to see her PCP. Pt would declines a work note.  ?

## 2021-09-18 NOTE — Discharge Instructions (Signed)
URI/COLD SYMPTOMS: Your exam today is consistent with a viral illness. Antibiotics are not indicated at this time. Use medications as directed, including cough syrup, nasal saline, and decongestants. Your symptoms should improve over the next few days and resolve within 7-10 days. Increase rest and fluids. F/u if symptoms worsen or predominate such as sore throat, ear pain, productive cough, shortness of breath, or if you develop high fevers or worsening fatigue over the next several days.    

## 2021-09-18 NOTE — ED Provider Notes (Signed)
?[place] ? ? ? ?CSN: [id number] ?Arrival date & time: 09/18/21  0801 ? ? ?  ? ?History   ?Chief Complaint ?Chief Complaint  ?Patient presents with  ? Cough  ? Nasal Congestion  ? ? ?HPI ?Maria Gardner is a 71 y.o. female presenting for 3-day history of "a sinus infection which is turning into a bronchial infection."  Patient says she has had a productive cough of yellowish sputum and nasal congestion as well as sinus pressure and yellow/green nasal drainage.  Denies associated fever.  Has been fatigued.  Denies body aches, ear pain, sinus pain, sore throat, chest pain, wheezing, shortness of breath, nausea/vomiting/diarrhea.  Patient reports she has taken a COVID test for work and it has been negative.  She says she has taken over-the-counter TheraFlu which has helped.  Patient reports history of asthma.  She is concerned because she says the infection seems to be going to her chest.  She has not needed to use any inhalers or nebulizers.  Patient reports she has been around her mother who has been sick with similar symptoms.  Patient says she gets the same thing every year and needs an antibiotic.  No other concerns. ? ?HPI ? ?Past Medical History:  ?Diagnosis Date  ? Asthma   ? Iron deficiency   ? ? ?There are no problems to display for this patient. ? ? ?Past Surgical History:  ?Procedure Laterality Date  ? ABDOMINAL HYSTERECTOMY    ? COLONOSCOPY WITH PROPOFOL N/A 02/04/2017  ? Procedure: COLONOSCOPY WITH PROPOFOL;  Surgeon: Lollie Sails, MD;  Location: [facility] ENDOSCOPY;  Service: Endoscopy;  Laterality: N/A;  ? SHOULDER ARTHROSCOPY WITH OPEN ROTATOR CUFF REPAIR Right 08/24/2015  ? Procedure: SHOULDER ARTHROSCOPY WITH OPEN ROTATOR CUFF REPAIR;  Surgeon: Thornton Park, MD;  Location: [facility] ORS;  Service: Orthopedics;  Laterality: Right;  ? TONSILLECTOMY    ? ? ?OB History   ?No obstetric history on file. ?  ? ? ? ?Home Medications   ? ?Prior to Admission medications   ?Medication Sig Start Date  End Date Taking? Authorizing Provider  ?acetaminophen (TYLENOL) 325 MG tablet Take 650 mg by mouth every 6 (six) hours as needed.   Yes [provider]  ?albuterol (PROVENTIL HFA;VENTOLIN HFA) 108 (90 Base) MCG/ACT inhaler Inhale 1 puff into the lungs as needed for wheezing or shortness of breath.   Yes [provider]  ?albuterol (PROVENTIL) (2.5 MG/3ML) 0.083% nebulizer solution Take 3 mLs (2.5 mg total) by nebulization every 6 (six) hours as needed for wheezing or shortness of breath. 09/08/16  Yes Marylene Land, NP  ?ALBUTEROL IN Inhale 1 puff into the lungs as needed (for shortness of breath).   Yes [provider]  ?benzonatate (TESSALON PERLES) 100 MG capsule Take 1 capsule (100 mg total) by mouth 3 (three) times daily as needed for cough. 10/18/19  Yes Hinda Kehr, MD  ?HYDROcodone-homatropine [facility]) 5-1.5 MG/5ML syrup Take 5 mLs by mouth every 6 (six) hours as needed for cough. 10/18/19  Yes Hinda Kehr, MD  ?ipratropium (ATROVENT HFA) 17 MCG/ACT inhaler Inhale 1 puff into the lungs as needed for wheezing.   Yes [provider]  ?ondansetron (ZOFRAN) 4 MG tablet Take 1 tablet (4 mg total) by mouth every 8 (eight) hours as needed for nausea or vomiting. 08/24/15  Yes Thornton Park, MD  ?oxyCODONE (OXY IR/ROXICODONE) 5 MG immediate release tablet Take 1-2 tablets (5-10 mg total) by mouth every 4 (four) hours as needed for  severe pain. 08/24/15  Yes Thornton Park, MD  ?predniSONE (DELTASONE) 10 MG tablet Take 4 tabs (40 mg) PO x 3 days, then take 2 tabs (20 mg) PO x 3 days, then take 1 tab (10 mg) PO x 3 days, then take 1/2 tab (5 mg) PO x 4 days. 10/18/19  Yes Hinda Kehr, MD  ? ? ?Family History ?Family History  ?Problem Relation Age of Onset  ? Breast cancer Neg Hx   ? ? ?Social History ?Social History  ? ?Tobacco Use  ? Smoking status: Former  ?  Types: Cigarettes  ?  Quit date: 08/04/1973  ?  Years since quitting: 48.1  ? Smokeless tobacco: Never  ?Vaping Use  ?  Vaping Use: Never used  ?Substance Use Topics  ? Alcohol use: Yes  ?  Comment: 1 glass of wine 2 times a year  ? Drug use: No  ? ? ? ?Allergies   ?Ammonia, Compazine [prochlorperazine edisylate], Morphine and related, Prednisolone, and Terbinafine and related ? ? ?Review of Systems ?Review of Systems  ?Constitutional:  Positive for fatigue. Negative for chills, diaphoresis and fever.  ?HENT:  Positive for congestion, rhinorrhea and sinus pressure. Negative for ear pain, sinus pain and sore throat.   ?Respiratory:  Positive for cough. Negative for shortness of breath and wheezing.   ?Cardiovascular:  Negative for chest pain.  ?Gastrointestinal:  Negative for abdominal pain, nausea and vomiting.  ?Musculoskeletal:  Negative for arthralgias and myalgias.  ?Skin:  Negative for rash.  ?Neurological:  Negative for weakness and headaches.  ?Hematological:  Negative for adenopathy.  ? ? ?Physical Exam ?Triage Vital Signs ?ED Triage Vitals  ?Enc Vitals Group  ?   BP   ?   Pulse   ?   Resp   ?   Temp   ?   Temp src   ?   SpO2   ?   Weight   ?   Height   ?   Head Circumference   ?   Peak Flow   ?   Pain Score   ?   Pain Loc   ?   Pain Edu?   ?   Excl. in [place]?   ? ?No data found. ? ?Updated Vital Signs ?BP 127/63 (BP Location: Left Arm)   Pulse 78   Temp 98.2 ?F (36.8 ?C) (Oral)   Resp 18   Ht '5\' 2"'$  (1.575 m)   Wt 187 lb (84.8 kg)   SpO2 98%   BMI 34.20 kg/m?  ? ?Physical Exam ?Vitals and nursing note reviewed.  ?Constitutional:   ?   General: She is not in acute distress. ?   Appearance: Normal appearance. She is ill-appearing (mildly). She is not toxic-appearing.  ?HENT:  ?   Head: Normocephalic and atraumatic.  ?   Right Ear: Tympanic membrane, ear canal and external ear normal.  ?   Left Ear: Tympanic membrane, ear canal and external ear normal.  ?   Nose: Congestion present.  ?   Mouth/Throat:  ?   Mouth: Mucous membranes are moist.  ?   Pharynx: Oropharynx is clear. Posterior oropharyngeal erythema (mild with clear  PND) present.  ?Eyes:  ?   General: No scleral icterus.    ?   Right eye: No discharge.     ?   Left eye: No discharge.  ?   Conjunctiva/sclera: Conjunctivae normal.  ?Cardiovascular:  ?   Rate and Rhythm: Normal rate and regular rhythm.  ?  Heart sounds: Normal heart sounds.  ?Pulmonary:  ?   Effort: Pulmonary effort is normal. No respiratory distress.  ?   Breath sounds: Normal breath sounds.  ?Musculoskeletal:  ?   Cervical back: Neck supple.  ?Skin: ?   General: Skin is dry.  ?Neurological:  ?   General: No focal deficit present.  ?   Mental Status: She is alert. Mental status is at baseline.  ?   Motor: No weakness.  ?   Gait: Gait normal.  ?Psychiatric:     ?   Mood and Affect: Mood normal.     ?   Behavior: Behavior normal.     ?   Thought Content: Thought content normal.  ? ? ? ?UC Treatments / Results  ?Labs ?(all labs ordered are listed, but only abnormal results are displayed) ?Labs Reviewed - No data to display ? ?EKG ? ? ?Radiology ?No results found. ? ?Procedures ?Procedures (including critical care time) ? ?Medications Ordered in UC ?Medications - No data to display ? ?Initial Impression / Assessment and Plan / UC Course  ?I have reviewed the triage vital signs and the nursing notes. ? ?Pertinent labs & imaging results that were available during my care of the patient were reviewed by me and considered in my medical decision making (see chart for details). ? ?71 year old female presenting for 3-day history of cough and nasal congestion.  Patient concerned about sinus infection.  Requesting antibiotic multiple times throughout visit.  Patient has been taking TheraFlu and says it has helped.  She is asthmatic but says she has not needed to use any breathing treatments or inhalers.  She denies any fevers.  Vital signs are all normal and stable.  She is mildly ill-appearing but nontoxic.  She does have nasal congestion and nasal mucosal edema without any obvious nasal drainage, mild posterior pharyngeal  erythema with clear postnasal drainage.  Chest is clear to auscultation and heart regular rate rhythm. ? ?Patient is already performed at home COVID test.  She declines to have another COVID test. ? ?Advise

## 2021-12-02 ENCOUNTER — Ambulatory Visit
Admission: EM | Admit: 2021-12-02 | Discharge: 2021-12-02 | Disposition: A | Discharge status: Home / Self Care | Payer: Medicare HMO

## 2021-12-02 ENCOUNTER — Encounter: Payer: Self-pay | Admitting: Emergency Medicine

## 2021-12-02 DIAGNOSIS — H6093 Unspecified otitis externa, bilateral: Secondary | ICD-10-CM | POA: Diagnosis not present

## 2021-12-02 MED ORDER — CIPROFLOXACIN HCL 0.2 % OT SOLN: 0.2000 mL | Freq: Two times a day (BID) | OTIC | 0 refills | Status: AC

## 2021-12-02 NOTE — ED Triage Notes (Signed)
Patient c/o bilateral ear pain that started 3 days ago.  Patient reports cold a month ago.  Patient denies fevers.

## 2021-12-02 NOTE — ED Provider Notes (Signed)
[facility]    CSN: [id number] Arrival date & time: 12/02/21  1448      History   Chief Complaint Chief Complaint  Patient presents with   Otalgia    bilateral    HPI Maria Gardner is a 71 y.o. female who presents with bilateral ear pain x 3 days. 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 Unbeknown to her the saline pump broke down and has been in a pool with Chlorine and not been doing the alcohol rinses post getting her ears wet, and developed swimmers ear. Denies fever or URI.     Past Medical History:  Diagnosis Date   Asthma    Iron deficiency     There are no problems to display for this patient.   Past Surgical History:  Procedure Laterality Date   ABDOMINAL HYSTERECTOMY     COLONOSCOPY WITH PROPOFOL N/A 02/04/2017   Procedure: COLONOSCOPY WITH PROPOFOL;  Surgeon: Lollie Sails, MD;  Location: [facility] ENDOSCOPY;  Service: Endoscopy;  Laterality: N/A;   SHOULDER ARTHROSCOPY WITH OPEN ROTATOR CUFF REPAIR Right 08/24/2015   Procedure: SHOULDER ARTHROSCOPY WITH OPEN ROTATOR CUFF REPAIR;  Surgeon: Thornton Park, MD;  Location: [facility] ORS;  Service: Orthopedics;  Laterality: Right;   TONSILLECTOMY      OB History   No obstetric history on file.      Home Medications    Prior to Admission medications   Medication Sig Start Date End Date Taking? Authorizing Provider  albuterol (PROVENTIL HFA;VENTOLIN HFA) 108 (90 Base) MCG/ACT inhaler Inhale 1 puff into the lungs as needed for wheezing or shortness of breath.   Yes [provider]  Ciprofloxacin HCl 0.2 % otic solution Place 0.2 mLs into both ears 2 (two) times daily. For 7-10 days 12/02/21  Yes Rodriguez-Southworth, Sunday Spillers, PA-C  ipratropium (ATROVENT HFA) 17 MCG/ACT inhaler Inhale 1 puff into the lungs as needed for wheezing.   Yes [provider]  Ipratropium-Albuterol  (COMBIVENT RESPIMAT) 20-100 MCG/ACT AERS respimat Inhale 1 puff into the lungs every 6 (six) hours.   Yes [provider]  acetaminophen (TYLENOL) 325 MG tablet Take 650 mg by mouth every 6 (six) hours as needed.    [provider]  albuterol (PROVENTIL) (2.5 MG/3ML) 0.083% nebulizer solution Take 3 mLs (2.5 mg total) by nebulization every 6 (six) hours as needed for wheezing or shortness of breath. 09/08/16   Marylene Land, NP  ALBUTEROL IN Inhale 1 puff into the lungs as needed (for shortness of breath).    [provider]  ondansetron (ZOFRAN) 4 MG tablet Take 1 tablet (4 mg total) by mouth every 8 (eight) hours as needed for nausea or vomiting. 08/24/15   Thornton Park, MD    Family History Family History  Problem Relation Age of Onset   Breast cancer Neg Hx     Social History Social History   Tobacco Use   Smoking status: Former    Types: Cigarettes    Quit date: 08/04/1973    Years since quitting: 48.3   Smokeless tobacco: Never  Vaping Use   Vaping Use: Never used  Substance Use Topics   Alcohol use: Yes    Comment: 1 glass of wine 2 times a year   Drug use: No     Allergies   Ammonia, Prednisone, Compazine [prochlorperazine edisylate], Morphine and  related, Prednisolone, and Terbinafine and related   Review of Systems Review of Systems  Constitutional:  Negative for fever.  HENT:  Positive for ear pain. Negative for congestion, ear discharge and rhinorrhea.      Physical Exam Triage Vital Signs ED Triage Vitals  Enc Vitals Group     BP 12/02/21 1512 111/75     Pulse Rate 12/02/21 1512 71     Resp 12/02/21 1512 14     Temp 12/02/21 1512 98.1 F (36.7 C)     Temp Source 12/02/21 1512 Oral     SpO2 12/02/21 1512 100 %     Weight 12/02/21 1508 185 lb (83.9 kg)     Height 12/02/21 1508 '5\' 2"'$  (1.575 m)     Head Circumference --      Peak Flow --      Pain Score 12/02/21 1507 5     Pain Loc --      Pain Edu? --      Excl. in [place]?  --    No data found.  Updated Vital Signs BP 111/75 (BP Location: Left Arm)   Pulse 71   Temp 98.1 F (36.7 C) (Oral)   Resp 14   Ht '5\' 2"'$  (1.575 m)   Wt 185 lb (83.9 kg)   SpO2 100%   BMI 33.84 kg/m   Visual Acuity Right Eye Distance:   Left Eye Distance:   Bilateral Distance:    Right Eye Near:   Left Eye Near:    Bilateral Near:     Physical Exam Vitals and nursing note reviewed.  Constitutional:      General: She is not in acute distress.    Appearance: She is not toxic-appearing.  HENT:     Ears:     Comments: Both canals are red, swollen and there is only small space and I cant see the TM. Has a lot of pain with external ear motion Eyes:     General: No scleral icterus.    Conjunctiva/sclera: Conjunctivae normal.  Pulmonary:     Effort: Pulmonary effort is normal.  Musculoskeletal:     Cervical back: Neck supple.  Skin:    General: Skin is warm and dry.  Neurological:     Mental Status: She is alert and oriented to person, place, and time.     Gait: Gait normal.  Psychiatric:        Mood and Affect: Mood normal.        Behavior: Behavior normal.        Thought Content: Thought content normal.        Judgment: Judgment normal.      UC Treatments / Results  Labs (all labs ordered are listed, but only abnormal results are displayed) Labs Reviewed - No data to display  EKG   Radiology No results found.  Procedures Procedures (including critical care time)  Medications Ordered in UC Medications - No data to display  Initial Impression / Assessment and Plan / UC Course  I have reviewed the triage vital signs and the nursing notes.  Bilateral OE  I placed her on Cipro as noted. She is aware to avoid water in her ear canals.  Final Clinical Impressions(s) / UC Diagnoses   Final diagnoses:  Otitis externa of both ears, unspecified chronicity, unspecified type   Discharge Instructions   None    ED Prescriptions     Medication Sig  Dispense Auth. Provider   Ciprofloxacin HCl 0.2 %  otic solution Place 0.2 mLs into both ears 2 (two) times daily. For 7-10 days 14 each Rodriguez-Southworth, Sunday Spillers, PA-C      PDMP not reviewed this encounter.   Shelby Mattocks, Hershal Coria 12/02/21 1535

## 2022-02-15 ENCOUNTER — Other Ambulatory Visit: Payer: Self-pay | Admitting: Family Medicine

## 2022-02-15 DIAGNOSIS — Z1231 Encounter for screening mammogram for malignant neoplasm of breast: Secondary | ICD-10-CM

## 2022-02-20 ENCOUNTER — Ambulatory Visit
Admission: RE | Admit: 2022-02-20 | Discharge: 2022-02-20 | Disposition: A | Discharge status: Home / Self Care | Payer: Medicare HMO | Source: Ambulatory Visit | Attending: Family Medicine | Admitting: Family Medicine

## 2022-02-20 DIAGNOSIS — Z1231 Encounter for screening mammogram for malignant neoplasm of breast: Secondary | ICD-10-CM | POA: Insufficient documentation

## 2022-04-24 ENCOUNTER — Other Ambulatory Visit: Payer: Self-pay | Admitting: Family Medicine

## 2022-04-24 DIAGNOSIS — Z1231 Encounter for screening mammogram for malignant neoplasm of breast: Secondary | ICD-10-CM

## 2022-04-25 ENCOUNTER — Ambulatory Visit
Admission: RE | Admit: 2022-04-25 | Discharge: 2022-04-25 | Disposition: A | Discharge status: Home / Self Care | Payer: Medicare HMO | Source: Ambulatory Visit | Attending: Family Medicine | Admitting: Family Medicine

## 2022-04-25 DIAGNOSIS — Z1231 Encounter for screening mammogram for malignant neoplasm of breast: Secondary | ICD-10-CM | POA: Insufficient documentation

## 2022-06-09 ENCOUNTER — Other Ambulatory Visit: Payer: Self-pay | Admitting: Orthopedic Surgery

## 2022-06-09 ENCOUNTER — Ambulatory Visit
Admission: RE | Admit: 2022-06-09 | Discharge: 2022-06-09 | Disposition: A | Discharge status: Home / Self Care | Payer: Medicare HMO | Source: Ambulatory Visit | Attending: Orthopedic Surgery | Admitting: Orthopedic Surgery

## 2022-06-09 DIAGNOSIS — M25562 Pain in left knee: Secondary | ICD-10-CM

## 2023-02-01 ENCOUNTER — Ambulatory Visit: Payer: Medicare HMO

## 2023-02-01 DIAGNOSIS — K635 Polyp of colon: Secondary | ICD-10-CM

## 2023-02-01 DIAGNOSIS — Z09 Encounter for follow-up examination after completed treatment for conditions other than malignant neoplasm: Secondary | ICD-10-CM

## 2023-02-01 DIAGNOSIS — K5730 Diverticulosis of large intestine without perforation or abscess without bleeding: Secondary | ICD-10-CM

## 2023-02-01 DIAGNOSIS — Z86010 Personal history of colonic polyps: Secondary | ICD-10-CM

## 2023-02-01 DIAGNOSIS — K640 First degree hemorrhoids: Secondary | ICD-10-CM

## 2023-06-03 ENCOUNTER — Encounter: Payer: Self-pay | Admitting: Orthopedic Surgery

## 2023-06-05 ENCOUNTER — Other Ambulatory Visit: Payer: Self-pay | Admitting: Orthopedic Surgery

## 2023-06-05 DIAGNOSIS — M7122 Synovial cyst of popliteal space [Baker], left knee: Secondary | ICD-10-CM

## 2024-01-09 ENCOUNTER — Other Ambulatory Visit: Payer: Self-pay | Admitting: Family Medicine

## 2024-01-09 DIAGNOSIS — Z1231 Encounter for screening mammogram for malignant neoplasm of breast: Secondary | ICD-10-CM

## 2024-02-12 ENCOUNTER — Ambulatory Visit
Admission: RE | Admit: 2024-02-12 | Discharge: 2024-02-12 | Disposition: A | Discharge status: Home / Self Care | Source: Ambulatory Visit | Attending: Family Medicine | Admitting: Family Medicine

## 2024-02-12 DIAGNOSIS — Z1231 Encounter for screening mammogram for malignant neoplasm of breast: Secondary | ICD-10-CM | POA: Diagnosis present
